# Patient Record
Sex: Female | Born: 1985 | Race: Black or African American | Hispanic: No | Marital: Single | Smoking: Former smoker
Health system: Southern US, Community
[De-identification: ages and names within clinical notes are randomized; demographics above are authoritative.]

## PROBLEM LIST (undated history)

## (undated) ENCOUNTER — Inpatient Hospital Stay (HOSPITAL_COMMUNITY): Payer: Self-pay

## (undated) DIAGNOSIS — D3162 Benign neoplasm of unspecified site of left orbit: Secondary | ICD-10-CM

## (undated) DIAGNOSIS — R030 Elevated blood-pressure reading, without diagnosis of hypertension: Secondary | ICD-10-CM

## (undated) DIAGNOSIS — I429 Cardiomyopathy, unspecified: Secondary | ICD-10-CM

## (undated) DIAGNOSIS — I10 Essential (primary) hypertension: Secondary | ICD-10-CM

## (undated) HISTORY — PX: INCISE AND DRAIN ABCESS: PRO64

## (undated) HISTORY — PX: WISDOM TOOTH EXTRACTION: SHX21

---

## 2003-12-05 ENCOUNTER — Other Ambulatory Visit: Admission: RE | Admit: 2003-12-05 | Discharge: 2003-12-05 | Payer: Self-pay | Admitting: Obstetrics and Gynecology

## 2004-05-31 ENCOUNTER — Inpatient Hospital Stay (HOSPITAL_COMMUNITY): Admission: AD | Admit: 2004-05-31 | Discharge: 2004-06-03 | Payer: Self-pay | Admitting: Obstetrics and Gynecology

## 2004-05-31 ENCOUNTER — Encounter (INDEPENDENT_AMBULATORY_CARE_PROVIDER_SITE_OTHER): Payer: Self-pay | Admitting: Specialist

## 2004-06-07 ENCOUNTER — Inpatient Hospital Stay (HOSPITAL_COMMUNITY): Admission: AD | Admit: 2004-06-07 | Discharge: 2004-06-13 | Payer: Self-pay | Admitting: Obstetrics and Gynecology

## 2004-06-12 ENCOUNTER — Encounter (INDEPENDENT_AMBULATORY_CARE_PROVIDER_SITE_OTHER): Payer: Self-pay | Admitting: Cardiology

## 2004-06-26 ENCOUNTER — Ambulatory Visit: Payer: Self-pay | Admitting: Infectious Diseases

## 2004-06-26 ENCOUNTER — Inpatient Hospital Stay (HOSPITAL_COMMUNITY): Admission: AD | Admit: 2004-06-26 | Discharge: 2004-07-01 | Payer: Self-pay | Admitting: Obstetrics and Gynecology

## 2004-06-28 ENCOUNTER — Encounter: Payer: Self-pay | Admitting: Infectious Diseases

## 2004-07-09 ENCOUNTER — Other Ambulatory Visit: Admission: RE | Admit: 2004-07-09 | Discharge: 2004-07-09 | Payer: Self-pay | Admitting: Obstetrics and Gynecology

## 2005-07-16 ENCOUNTER — Other Ambulatory Visit: Admission: RE | Admit: 2005-07-16 | Discharge: 2005-07-16 | Payer: Self-pay | Admitting: Obstetrics and Gynecology

## 2009-05-20 ENCOUNTER — Inpatient Hospital Stay (HOSPITAL_COMMUNITY): Admission: AD | Admit: 2009-05-20 | Discharge: 2009-05-21 | Payer: Self-pay | Admitting: Obstetrics & Gynecology

## 2009-07-02 ENCOUNTER — Ambulatory Visit: Payer: Self-pay | Admitting: Obstetrics and Gynecology

## 2009-07-02 ENCOUNTER — Inpatient Hospital Stay (HOSPITAL_COMMUNITY): Admission: AD | Admit: 2009-07-02 | Discharge: 2009-07-02 | Payer: Self-pay | Admitting: Obstetrics & Gynecology

## 2010-06-30 ENCOUNTER — Emergency Department (HOSPITAL_COMMUNITY): Admission: EM | Admit: 2010-06-30 | Discharge: 2010-06-30 | Payer: Self-pay | Admitting: Emergency Medicine

## 2010-12-05 LAB — URINALYSIS, ROUTINE W REFLEX MICROSCOPIC
Bilirubin Urine: NEGATIVE
Glucose, UA: NEGATIVE mg/dL
Hgb urine dipstick: NEGATIVE
Nitrite: NEGATIVE
Protein, ur: NEGATIVE mg/dL
Specific Gravity, Urine: 1.015 (ref 1.005–1.030)

## 2010-12-26 LAB — URINALYSIS, ROUTINE W REFLEX MICROSCOPIC
Ketones, ur: NEGATIVE mg/dL
Urobilinogen, UA: 0.2 mg/dL (ref 0.0–1.0)
pH: 7.5 (ref 5.0–8.0)

## 2011-02-07 NOTE — Discharge Summary (Signed)
Lydia Blair, GENRICH              ACCOUNT NO.:  [id number]   MEDICAL RECORD NO.:  [id number]          PATIENT TYPE:  INP   LOCATION:  9128                          [facility]ACILITY:  [facility]   PHYSICIAN:  Rudy Jew. Ashley Royalty, M.D.DATE OF BIRTH:  1985-09-30   DATE OF ADMISSION:  05/31/2004  DATE OF DISCHARGE:  06/03/2004                                 DISCHARGE SUMMARY   DISCHARGE DIAGNOSES:  1.  Intrauterine pregnancy at [redacted] weeks gestation, delivered.  2.  Rh negative.  3.  Group B strep carrier.  4.  Premature rupture of membranes.  5.  Nonreassuring fetal heart tracing.   OPERATION AND SPECIAL PROCEDURES:  Primary low transverse cesarean section  per Gaetano Hawthorne. Lily Peer, M.D.   CONSULTATIONS:  None.   DISCHARGE MEDICATIONS:  Percocet.   HISTORY AND PHYSICAL:  This is a 25 year old primigravida at [redacted] weeks  gestation with the aforementioned diagnoses.  The patient presented on the  day of admission complaining of feeling moist since approximately 3 a.m.  She was subsequently diagnosed as having ruptured membranes and referred to  labor and delivery for admission and delivery.  For the remainder of the  history and physical, please see chart.   HOSPITAL COURSE:  The patient was admitted to [facility] of  [place].  Admission laboratory studies were drawn.  On May 31, 2004  at 1:15 p.m., the cervix was 4 cm dilated, 90% effaced, -1 station, vertex  presentation.  Antibiotics and epidural were initiated.  On May 31, 2004 at 6:50 p.m., the patient was diagnosed as having nonreassuring fetal  heart tracing and taken to the operating room by Dr. Lily Peer. She  underwent a primary low transverse cesarean section.  The procedure yielded  a 6 pound 7 ounce female, Apgar's 8 at 1 minute, 9 at 5 minutes and sent to  the newborn nursery.  The patient's postpartum course was benign. She was  discharged on June 03, 2004 afebrile and in satisfactory condition.  WBC on admission  was 8.1, hemoglobin 13.6.  Repeat values obtained June 01, 2004 were 10.2 and 9.6 respectively.   DISPOSITION:  The patient is to return to [facility]  in 4-6 weeks for postpartum evaluation.      JAM/MEDQ  D:  08/15/2004  T:  08/16/2004  Job:  [id number]

## 2011-02-07 NOTE — Discharge Summary (Signed)
Lydia Blair, Lydia Blair              ACCOUNT NO.:  [id number]   MEDICAL RECORD NO.:  [id number]          PATIENT TYPE:  INP   LOCATION:  9304                          [facility]   PHYSICIAN:  Rudy Jew. Ashley Royalty, M.D.DATE OF BIRTH:  1986-04-30   DATE OF ADMISSION:  06/07/2004  DATE OF DISCHARGE:  06/13/2004                                 DISCHARGE SUMMARY   DIAGNOSES:  1.  Status post cesarean section.  2.  Non-compliance with OB/GYN care.  3.  Superficial wound dehiscence.  4.  Hypertension - apparently essential.   CONSULTATIONS:  [facility], Melissa L. Ladona Ridgel, M.D.   DISCHARGE MEDICATIONS:  1.  Hydrochlorothiazide 25 mg daily.  2.  Metoprolol 50 mg b.i.d.  3.  Keflex 500 mg q.i.d. times three day.  4.  K-Dur 20 mEq daily.   HISTORY AND PHYSICAL:  This is a 25 year old gravida 1, para 1, status post  cesarean section May 31, 2004.  She was delivered via C-section due to  arrest disorder of dilatation.  The procedure was uncomplicated and she was  allowed to be discharged home on or about June 03, 2004.  At the time  of the discharge, the patient was asked to take her temperature frequently  and to notify the office of any elevation of 100.4 degrees Fahrenheit or  greater.  On June 06, 2004, the patient presented to the office for an  incision check.  At that time, she denied any visual disturbances,  epigastric or right upper quadrant pain.  She also denied having a fever.  I  went on to question her whether she had taken her temperature and she said  no, I do not have a thermometer.  The incision at that time was found to  be non-indurated, non-erythematous, dry and intact.  Once again, the patient  was entreated to take her temperature often and call for any elevation as  previously mentioned.  She was also asked to call for any adverse changes  with respect to her clinical situation, including, but not limited to,  increased incisional discharge,  redness, increasing pain, etc.   The patient presented on the day of admission, complaining of leakage of  fluid from her incision.  She was once again questioned regarding any  temperature elevation and admitted that she had still not taken her  temperature and still had not obtained a thermometer.  She denied any  headaches, visual disturbances, epigastric or right upper quadrant pain.  The physical examination revealed some modest moisture to the right of  midline at the area of the patient's incision.  Careful inspection revealed  no frank dehiscence at that time.  There was no erythema or induration.  Temperature was noted to be 101.3 degrees Fahrenheit.  __________ to the  patient's low grade fever, incisional moisture and marked non-compliance  with OB/GYN care, the decision was made to admit her for further evaluation  and therapy. For the remainder of the history and physical, please see the  chart.   HOSPITAL COURSE:  The patient was admitted to [facility] of  [place].  Admission laboratory studies were drawn.  The antibiotics were  initiated.  On June 08, 2004, a very superficial wound dehiscence was  noted in the area of the right aspect of the patient's incision.  It had a  depth of only 8 to 10 mm.  Wound care was performed.  The patient's  temperature elevation was noted to be improving.  The patient was  demonstrating at this time, a modest elevation of her blood pressures.  Later on June 08, 2004, the blood pressure was noted to be  approximately 160/110.  The patient was on labetalol and the blood pressure  appeared refractory to that product. The patient denied any severe  symptoms.  As a precaution, the patient was begun on magnesium sulfate.  The  [facility] panel was benign.  Potassium was 2.8.  The differential with respect to  the hypertension included pregnancy induced versus chronic hypertension.  The patient continued to demonstrate no evidence of  any symptoms of severe  pre-eclampsia or laboratory aberrations consistent with same.  Blood  pressure remained rather refractory with averages of approximately 165  systolic and 95 to 100 diastolic.  For this reason, the [facility] hospitalist  was consulted.  Dr. Efraim Kaufmann L. Ladona Ridgel saw the patient on June 11, 2004.  Workup was initiated for hypertension.  The patient had a renal ultrasound  and a 2D echocardiogram.  As best as could be gleaned from the notes, the  over-riding clinical impression on her part was that of essential  hypertension.  She recommended some medication changes, which were  instituted and the patient's blood pressure improved.  On June 13, 2004, the patient was felt to be stable for discharge and was discharged  home in a satisfactory condition on the aforementioned medications.   __________ CLINICAL FINDINGS:  Echocardiogram was normal.  Sonogram of the  kidneys was normal.  Chest x-ray revealed no active disease.   DISPOSITION:  The patient is to return for __________ gynecology and  obstetrics in four to six weeks for post-partum evaluation.  In addition,  efforts will be undertaken to have her obtain a primary care physician for  continued care of her apparent chronic hypertension.      JAM/MEDQ  D:  08/15/2004  T:  08/16/2004  Job:  [id number]

## 2011-02-07 NOTE — Consult Note (Signed)
Lydia Blair, Lydia Blair              ACCOUNT NO.:  [id number]   MEDICAL RECORD NO.:  [id number]          PATIENT TYPE:  INP   LOCATION:  9304                          [facility]   PHYSICIAN:  Melissa L. Ladona Ridgel, MD  DATE OF BIRTH:  03-08-1986   DATE OF CONSULTATION:  06/11/2004  DATE OF DISCHARGE:  06/13/2004                                   CONSULTATION   REQUESTING PHYSICIAN:  Dr. Sylvester Harder.   REASON FOR CONSULTATION:  Uncontrolled hypertension in a postpartum patient.   HISTORY OF PRESENT ILLNESS:  The patient is a 25 year old African-American  female status post C-section delivery of her first child.  The patient does  not have a primary care physician and we have been asked to assist with her  current hypertensive issues.  The patient underwent C-section secondary to  arrested dilatation approximately 2 weeks ago.  There is no report of  predelivery blood pressure issues.  The patient returned to her post  delivery follow-up appointment and initially complained of some discharge  from the wound and she was requested to follow her temperatures, and return  for another visit.  The patient returned for a visit not having followed up  on her temperatures and further complained of wound problems.  The wound was  found to be erythematous with some discharge and she was febrile.  She  therefore was admitted to the hospital.  During the admission, the patient  was found to have both elevated systolic and diastolic blood pressures with  low potassium.  The cause for this was unclear.  Pregnancy-induced  hypertension is a possibility; however, at this time the source for her  blood pressure elevations is unclear.   PAST MEDICAL HISTORY:  She has none.  She did have group B strep infection  noted during her prepregnancy exam.  Her blood pressures during her prenatal  period were 110/60.   PAST SURGICAL HISTORY:  She did have a C-section secondary to failure to  dilate.   ALLERGIES:  No known drug allergies.   MEDICATIONS:  She was taking oxycodone at home for pain.   SOCIAL HISTORY:  She quit smoking about a year ago.  She denies ethanol.  She denies illicit drug use.   FAMILY HISTORY:  Mom and dad are deceased with no documented medical  history.  She states that she is not aware of anybody in the family having  renal disease, being on dialysis, or having hypertension.   HOSPITAL MEDICATIONS:  1.  Potassium 20 mEq b.i.d.  2.  Labetalol 200 mg p.o. t.i.d.  3.  Codeine p.r.n.  4.  Phenergan p.r.n.  5.  Ambien 10 mg p.r.n.  6.  Hydralazine 2-5 mg p.r.n.   She is also on lactated Ringers at 75 mL/hour.   REVIEW OF SYSTEMS:  No nausea, vomiting, diarrhea, blurred vision, or  headache.  She denies palpitations, dysuria.  All other review of systems  are negative.   PHYSICAL EXAMINATION:  VITAL SIGNS:  Temperature is 97.6, blood pressure  155/89 - this was status post 10 mg of hydralazine.  Her  previous blood  pressure was 180/105 to 110.  Respirations are 20, saturations are 98%.  GENERAL:  This is a mildly obese African-American female in no acute  distress with a withdrawn affect.  HEENT:  Her pupils are equal, round, reactive to light.  Extraocular muscles  are intact.  Mucous membranes are moist.  NECK:  Supple.  There is no JVD, no lymph nodes.  CHEST:  Clear to auscultation.  There are no rhonchi, rales, or wheezes.  CARDIOVASCULAR:  Regular rate and rhythm, positive S1, S2.  No S3, S4.  No  murmurs, rubs, or gallops.  ABDOMEN:  Soft, obese, nontender, nondistended, with positive bowel sounds.  The incision is clean, dry, and intact.  The left side of the incision  appears to be healing well.  EXTREMITIES:  No clubbing, cyanosis, or edema.  NEUROLOGIC:  Cranial nerves II-XII are intact.  Power is 5/5.  DTRs are 2+.   Her last laboratories reveal a uric acid of 4.4, magnesium of 4.6, an LDH of  385, white cells of 7.5, hemoglobin of 9,  hematocrit of 26.9, platelets of  404.  Her sodium is 142, potassium 2.7, BUN is 1, creatinine is 0.7.   ASSESSMENT AND PLAN:  This is a 25 year old African-American female admitted  for postpartum fever workup, was found to be hypertension, with decreased  potassium.   1.  Cardiovascular.  The patient would benefit from a 2-D echocardiogram to      assess end-organ damage with regard to longstanding blood pressure      issues.  This would also elicit any cardiac causes for her hypertension.      At this time I would change her diet to a no added salt and limit her      salt intake.  Because of the increased delivery of sodium with the      lactated Ringers, I would discontinue her lactated Ringers and begin      half normal saline with 20 of potassium.  2.  Genitourinary.  I would proceed with a minimal renal workup for possible      causes for blood pressure; in particular, I would check an ultrasound on      the bilateral kidneys to assess size and echogenicity.  If the kidneys      are slightly decreased in size, I would consider MRA to rule out renal      artery stenosis and I would also check an aldosterone level.  3.  Need to rule out occult causes for hypertension which in this case would      mean checking a urine drug screen.  4.  Hypertension management.  At this time I would start lisinopril 10 mg      p.o. daily.  Continue the labetalol at this time.  However, I would make      this 200 mg p.o. b.i.d. with hold parameters for heart rates less than      60 or systolic blood pressures less than 110.  I would start her on      hydralazine 10 mg p.o. q.i.d. with hold parameters for systolic less      than 110 and utilize the hydralazine p.r.n. for blood pressures greater      than 190 systolically, or diastolic blood pressures greater than 110.  5.  I would replete her potassium orally and IV to between 3.5 and 4.  We will continue to follow with you as we care for this  patient's blood  pressure.  Thank you for allowing [place] to see her.     Meli   MLT/MEDQ  D:  06/14/2004  T:  06/14/2004  Job:  [id number]

## 2011-02-07 NOTE — H&P (Signed)
NAMESTEPAHNIE, Lydia Blair                        ACCOUNT NO.:  [id number]   MEDICAL RECORD NO.:  [id number]                   PATIENT TYPE:  INP   LOCATION:  9317                                 [facility]   PHYSICIAN:  James A. Ashley Royalty, M.D.             DATE OF BIRTH:  July 31, 1986   DATE OF ADMISSION:  06/07/2004  DATE OF DISCHARGE:                                HISTORY & PHYSICAL   HISTORY OF PRESENT ILLNESS:  This is a 25 year old gravida 1 para 1 status  post C-section May 31, 2004 who presented to the office complaining of  incisional discharge.  Her prenatal care was complicated by GBS carrier  status, B negative blood type only.  She delivered via cesarean section  May 31, 2004 secondary to arrest disorder of dilatation.  The procedure  was uncomplicated and she was allowed to be discharged home on or about  June 03, 2004.  At the time of the discharge the patient was asked to  take her temperature frequently and notify the office of any elevation of  100.4 degrees Fahrenheit or greater.   On June 06, 2004 the patient presented to the office for incision  check.  At that time she denied any visual disturbances, epigastric, or  right upper quadrant pain or fever.  I did question her as to whether she  had taken her temperature and she said, no, I don't have a thermometer.  Incision was found to be nonerythematous, nonindurated.  She was asked to  take her temperature once again and call for any elevation as mentioned  above.  She was also asked to call for any adverse changes with respect to  her clinical situation including but not limited to increased incisional  discharge, redness, increasing pain, etc.   The patient presented today complaining of continued leakage of fluid from  her incision.  She was once again questioned regarding any temperature  elevation and admitted she still had not taken her temperature and still had  not obtained a  thermometer.  She denies any headaches, visual disturbances,  epigastric, or right upper quadrant pain.  She has been taking approximately  two oxycodone tablets per 24 hours for incisional discomfort.   MEDICATIONS:  As above.   PAST MEDICAL HISTORY:  1.  Medical:  Negative.  2.  Surgical:  Negative.  3.  Allergies:  Negative.   FAMILY HISTORY:  Noncontributory.   SOCIAL HISTORY:  The patient denies use of tobacco or significant alcohol.   REVIEW OF SYSTEMS:  Noncontributory.   PHYSICAL EXAMINATION:  GENERAL:  Well-developed, well-nourished, black  female in no acute distress.  VITAL SIGNS:  Temperature 101.3 degrees Fahrenheit, blood pressure 140/100,  vital signs stable.  SKIN:  Warm and dry without lesions.  LYMPH:  There is no supraclavicular, cervical, or inguinal adenopathy.  HEENT:  Normocephalic.  NECK:  Supple without thyromegaly.  CHEST:  Lungs are clear.  CARDIAC:  Regular rate and rhythm without murmurs, gallops, or rubs.  BREASTS:  Bilaterally without dominant mass, retraction, adenopathy,  erythema, or induration.  ABDOMEN:  Soft and nontender.  Bowel sounds are active.  Inspection of the  incision reveals moisture present (modest) just to the right of the midline.  Careful inspection reveals no dehiscence.  There is no erythema or  induration whatsoever.  The Steri-Strips removed and the incision probed.  Again, no area of dehiscence was noted.  The incision was cleansed with  peroxide and Steri-Strips reapplied with Benzoin.  MUSCULOSKELETAL:  No CVA tenderness.  DTRs are 2+ and equal.  There is trace  edema.  PELVIC:  External genitalia within normal limits.  Vagina and cervix were  without gross lesions.  Bimanual examination reveals the uterus to be  approximately 14-week size.  There is modest tenderness over the fundus with  palpation.   IMPRESSION:  1.  Status post cesarean section 1 week ago.  2.  Fever - differential includes endometritis, possible  incisional      infection (doubt), etc.  3.  Noncompliance with obstetrical and gynecological care and      recommendations.   PLAN:  Admit to [facility].  Will perform fever workup including PIH  panel.  Will initiate IV antibiotics.  Will watch incision carefully.                                               James A. Ashley Royalty, M.D.    JAM/MEDQ  D:  06/07/2004  T:  06/07/2004  Job:  [id number]

## 2011-02-07 NOTE — H&P (Signed)
Lydia Blair, Lydia Blair                        ACCOUNT NO.:  [id number]   MEDICAL RECORD NO.:  [id number]                   PATIENT TYPE:  INP   LOCATION:  9174                                 [facility]   PHYSICIAN:  Rudy Jew. Ashley Royalty, M.D.             DATE OF BIRTH:  [birth year]   DATE OF ADMISSION:  05/31/2004  DATE OF DISCHARGE:                                HISTORY & PHYSICAL   HISTORY OF PRESENT ILLNESS:  This is a 25 year old primigravida, [facility]  June 21, 2004, at 37 weeks.  Prenatal care has been complicated by GBS  carrier, B-negative blood type for which she received RhoGAM at 28 weeks.   The patient presented today for routine office visit.  She stated she had  been feeling moist since approximately 3 a.m.  Sterile speculum  examination was performed which revealed positive pooling, positive ferning  and positive Nitrazine.  The patient is hence referred to labor and delivery  for admission, and delivery.   MEDICATION:  Vitamins.   PAST MEDICAL HISTORY:  Please see [place] forms.   PHYSICAL EXAMINATION:  GENERAL:  Well-developed, well-nourished, pleasant  black female in no acute distress, afebrile.  VITAL SIGNS:  Vital signs stable.  SKIN:  Skin warm and dry without lesions.  LYMPH:  There is no supraclavicular, cervical or inguinal adenopathy.  HEENT:  Normocephalic.  NECK:  Neck supple without thyromegaly.  CHEST:  Lungs are clear.  CARDIAC:  Regular rate and rhythm.  BREAST EXAM:  Deferred.  ABDOMEN:  Abdomen soft and nontender.  It is gravid with a fundal height of  approximately 36 cm.  Fetal heart tones are auscultated with a Doppler.  PELVIC:  Please see sterile speculum examination above.  Digital examination  deferred until patient is evaluated at [facility].   IMPRESSION:  1.  Intrauterine pregnancy at 37 weeks' gestation.  2.  B-negative blood type.  3.  Group B streptococcus carrier.  4.  Premature rupture of membranes.   PLAN:   Admit to [facility].  Will confirm presentation and assess the  need for Pitocin augmentation.  Will give antibiotic prophylaxis.                                              James A. Ashley Royalty, M.D.   JAM/MEDQ  D:  05/31/2004  T:  05/31/2004  Job:  [id number]

## 2011-02-07 NOTE — Discharge Summary (Signed)
Lydia Blair, Lydia Blair              ACCOUNT NO.:  [id number]   MEDICAL RECORD NO.:  [id number]          PATIENT TYPE:  INP   LOCATION:  9320                          FACILITY:  [facility]   PHYSICIAN:  James A. Ashley Royalty, M.D.DATE OF BIRTH:  July 05, 1986   DATE OF ADMISSION:  06/26/2004  DATE OF DISCHARGE:  07/01/2004                                 DISCHARGE SUMMARY   DISCHARGE DIAGNOSES:  1.  Status post cesarean section.  2.  Rectus hematoma/abscess post cesarean section.  3.  Hypertension - probably essential - stable.   OPERATIONS AND SPECIAL PROCEDURES:  Hematoma/abscess incision with placement  of drain per interventional radiology - June 28, 2004.   CONSULTATIONS:  1.  Interventional radiology.  2.  Infectious disease (Dr. Ninetta Lights).   MEDICATIONS:  1.  Augmentin 875 mg b.i.d.  2.  Metoprolol 50 mg b.i.d.   HISTORY AND PHYSICAL:  This is a 25 year old gravida 1 para 1 status post  cesarean section May 31, 2004.  The patient was rehospitalized  June 07, 2004 complaining of incisional discharge.  She had been  noncompliant at home with respect to taking her temperature and it was felt  in her best interest to be admitted for further evaluation and therapy.  In  the course of the hospitalization, a superficial wound dehiscence was noted  which appeared to essentially completely low.  The low-grade fever was  worked up and felt to be secondary to endometritis.  The patient defervesced  nicely.  During the course of the hospitalization, the blood pressure was  noted to be frequently elevated.  [facility] were consulted.  The  workup revealed essential hypertension.  The patient was discharged home on  antihypertensive medications including metoprolol 50 mg b.i.d.,  hydrochlorothiazide 25 mg b.i.d., Keflex, and supplemental potassium.  The  patient was instructed to call the number provided by the hospitalists in  order to obtain a primary care physician but made no  such call.  She  presented to the office earlier in the week for a recheck.  She stated the  incision was slightly tender on the right side.  However, she denied any  discoloration, discharge, or fever.  She was given a prescription for  Augmentin 875 mg b.i.d. and asked to call for any adverse changes, and also  to call on June 26, 2004 for an update.  She called on the morning of  admission stating her temperature was 100.3 degrees Fahrenheit.  She was  asked to come to maternity admissions immediately for further evaluation.  At the time of the admission, the patient was noted to be afebrile.  There  was perhaps a 2 x 2 mm dehiscence of the right aspect of the uterine  incision, but no other abnormalities were appreciated.  Due to the history  of the fever and noncompliance with her GYN care, the decision was made to  admit the patient for further observation and therapy.  For the remainder of  the history and physical please see chart.   HOSPITAL COURSE:  The patient was admitted to [facility].  Admission laboratory studies were drawn.  The following  morning, the patient was noted to have had a temperature as high as 103  degrees Fahrenheit.  Workup was continued including a CT scan which revealed  a 7 x 5 x 4 cm hematoma versus abscess in the rectus area on the right  aspect of the patient's abdomen.  Infectious disease was consulted.  Dr.  Ninetta Lights saw the patient and felt the most likely diagnosis was a  postoperative abscess in the rectus area.  Antibiotics were changed from  Mefoxin to Zosyn.  A protuberance was noted in the abdomen in the area of  the right rectus muscle.  The decision was made to consult interventional  radiology for possible drainage.  On June 28, 2004 interventional  radiology succeeded in draining the fluid collection and placing a Al Pimple drain into the bed.  Cultures were obtained.  Ultimate culture  revealed micrococcus.   There was no elaboration or sensitivities as of the  time of this dictation.  The patient defervesced.  Repeat CT scan was  obtained July 01, 2004 and revealed resolution of the previously-noted  mass.  On July 01, 2004 the patient was noted to be afebrile for greater  than 24 hours.  She stated a desire to be discharged home, and was  discharged home afebrile and in satisfactory condition.   ACCESSORY CLINICAL FINDINGS:  White blood count on October 5 revealed a  value of 13,300.  Repeat value obtained June 28, 2004 was 10.8.  Blood  cultures were initially without any growth.  C. difficile toxin was  negative.  Catheterized urine culture was negative.  Partial thromboplastin  time was 43.  CBC on October 7 prior to discharge revealed a white blood  count of 10,000; hemoglobin 12.4; and hematocrit 36.1.   PA and lateral chest was negative.   DISPOSITION:  The patient is to return to [facility] and Obstetrics  in approximately 1 week.  She was specifically instructed to call for any  temperature elevation greater than 100.4 degrees Fahrenheit.  She is to  continue her metoprolol 50 mg b.i.d. and Augmentin 875 mg b.i.d.  We will  allow her to come off her hydrochlorothiazide for the time being and assess  the blood pressure on an outpatient basis.   The patient is again entreated to call the number provided to her by the  [facility] in order to obtain a primary care physician.  She states  she understands and accepts.  Questions invited and answered.      JAM/MEDQ  D:  07/01/2004  T:  07/02/2004  Job:  [id number]

## 2011-02-07 NOTE — Op Note (Signed)
Lydia Blair, Lydia Blair                        ACCOUNT NO.:  [id number]   MEDICAL RECORD NO.:  [id number]                   PATIENT TYPE:  INP   LOCATION:  9174                                 [facility]ACILITY:  [facility]   PHYSICIAN:  Juan H. Lily Peer, M.D.             DATE OF BIRTH:  1985-12-10   DATE OF PROCEDURE:  05/31/2004  DATE OF DISCHARGE:                                 OPERATIVE REPORT   INDICATIONS FOR PROCEDURE:  A 25 year old, primigravida, [redacted] weeks gestation  admitted earlier today with spontaneous rupture of membranes, GBS carrier  was started on Pen-G for prophylaxis and the patient progressed in labor to  complete dilatation but prior that she had been started on Pitocin  augmentation due to slight protracted laboring and began experiencing fetal  bradycardia to the 50-70 beat per minute range and despite discontinuation  of the Pitocin, lateral positioning and oxygen administration, fetal  bradycardia persisted. She was taken for emergency primary cesarean section.   PREOPERATIVE DIAGNOSES:  1.  Term intrauterine pregnancy at 39 weeks estimated gestational age.  2.  Spontaneous rupture of membranes.  3.  Fetal distress/fetal bradycardia.   POSTOPERATIVE DIAGNOSES:  1.  Term intrauterine pregnancy at 73 weeks estimated gestational age.  2.  Spontaneous rupture of membranes.  3.  Fetal distress/fetal bradycardia.   ANESTHESIA:  Epidural.   SURGEON:  Juan H. Lily Peer, M.D.   PROCEDURE:  Emergency primary lower uterine segment transverse cesarean  section.   FINDINGS:  Nuchal cord x1, viable female infant, Apgar's of 8 & 9, clear  amniotic fluid, normal maternal pelvic anatomy, arterial cord pH of 7.28,  weight of 6 pounds 7 ounces.   DESCRIPTION OF PROCEDURE:  The patient was taken emergently to the operating  room due to the persistent fetal bradycardia. As she was being prepped, the  last fetal heart rate was recorded at 103 beats per minute and pelvic  examination the  vertex was in the +1 station.  Epidural was redosed, the  abdomen was prepped emergently, Foley catheter was already in place. A  Pfannenstiel skin incision was made 2 cm above the symphysis pubis, the  incision was carried down from the skin and subcutaneous tissue and down to  the rectus fascia where a midline nick was made. The fascia was incised a  transverse fashion, the peritoneal cavity was entered cautiously, the  bladder flap was established, the lower uterine segment was incised in a  transverse fashion. Clear amniotic fluid was present, the newborn's head was  delivered, the nuchal cord was reduced. The nasopharyngeal area was bulb  suctioned and the remainder of the newborn was delivered.  The newborn gave  an immediate cry after the cord was clamped and cut, shown to the parents  and passed off to the pediatrician who gave the above mentioned parameters.  After cord blood was obtained, the placenta was delivered from the  intrauterine cavity and was  submitted for pathological evaluation.  Cord  blood had previously been obtained.  The uterus was then exteriorized, the  intrauterine cavity was swept clear of the remaining products of conception.  The uterus was closed in a single layer fashion with #0 Vicryl suture in a  locking stitch manner.  Both tubes and ovaries were inspected and appear  normal in appearance. The uterus was placed back in the pelvic cavity, the  pelvic cavity was copiously irrigated with normal saline solution. After  ascertaining adequate hemostasis was eminent, closure was started. The  visceroperitoneum was not reapproximated but the rectus fascia was closed  with a running stitch of #0 Vicryl suture.  The subcutaneous bleeders were  Bovie cauterized, the skin was reapproximated with skin clips followed by  placement of Xeroform gauze and 4 x 8 dressing. The patient was transferred  to the recovery room with stable vital signs. Blood loss for the  procedure  was 1200 mL.  IV fluid 1200 mL of lactated Ringer's, urine output 400 mL.                                               Juan H. Lily Peer, M.D.    JHF/MEDQ  D:  05/31/2004  T:  06/01/2004  Job:  [id number]   cc:   Fayrene Fearing A. Ashley Royalty, M.D.  [address]  [place], [place] [address]  Fax: [phone]

## 2011-02-07 NOTE — H&P (Signed)
Lydia Blair, Lydia Blair              ACCOUNT NO.:  [id number]   MEDICAL RECORD NO.:  [id number]          PATIENT TYPE:  MAT   LOCATION:  MATC                          [facility]   PHYSICIAN:  Rudy Jew. Ashley Royalty, M.D.DATE OF BIRTH:  06-04-1986   DATE OF ADMISSION:  06/26/2004  DATE OF DISCHARGE:                                HISTORY & PHYSICAL   HISTORY OF PRESENT ILLNESS:  This is a 25 year old, gravida 1, para 1 status  post cesarean section May 31, 2004. The patient was rehospitalized  June 07, 2004 complaining of incisional discharge. She had been  noncompliant at home with respect to taking her temperature and it was felt  in her best interest to be admitted for further evaluation and therapy. In  the course of the hospitalization, the small superficial wound dehiscence  appeared to completely close. A low grade fever was worked up and felt to be  secondary to endometritis. The patient defervesced  nicely. During the  course of the hospitalization, the blood pressure was noted to be frequently  elevated. The patient was placed on labetalol and later hydralazine. She was  also noted to be hypokalemic. [facility] hospitalist was consulted. The result  included a change of medications including metoprolol 50 mg b.i.d.,  hydrochlorothiazide 25 mg b.i.d., Keflex 500 mg q.i.d. and K-Dur 20 mEq q.d.  The patient was instructed to call the number provided by the hospitalist in  order to obtain a primary care physician but to date has made no such call.  The patient presented to the office earlier in the week for a brief check.  He stated the incision was slightly tender empirically on the right side.  However, she denied any discoloration, discharge, or current fever. Exam was  benign.  The patient was given a prescription for Augmentin 875 mg b.i.d.  and asked to call for any adverse change and also to call today to give [place]  an update. She called this morning stating that she had a  temperature of  101.3 and was asked immediately to come to Maternity Admissions as the  office would not be seeing patients this afternoon.   After arrival at Maternity Admissions Unit at [facility], the patient  was questioned. She denied sputum production, calf tenderness, dysuria,  incisional drainage. Her only complaint was a very modest discomfort in the  right lower quadrant which she rated only 3-4 on a scale of 0-10.   MEDICATIONS:  As above.   PAST MEDICAL HISTORY:  Medication negative. Surgical, cesarean section.  Allergies negative, history noncontributory.   SOCIAL HISTORY:  The patient denies use of tobacco or alcohol.   REVIEW OF SYMPTOMS:  Noncontributory.   PHYSICAL EXAMINATION:  GENERAL:  Well-developed, well-nourished, pleasant,  black female in no acute distress.  VITAL SIGNS:  Temperature in Maternity Admissions Unit 98.7, pulse 88,  respirations 20, blood pressure 101/61.  SKIN:  Warm and dry without lesions.  LYMPH:  There is no supraclavicular, cervical or inguinal adenopathy.  HEENT:  Normocephalic.  NECK:  Supple without thyromegaly.  CHEST:  Lungs are clear.  CARDIAC:  Regular rate and rhythm without murmurs, rubs or gallops.  BREASTS:  There is no dominant mass, traction or adenopathy bilaterally.  ABDOMEN:  Soft, nontender without masses or organomegaly. Bowel sounds are  active. The incision appears to be essentially healed. There is perhaps a 2  x 2 mm dehiscence on the right side which is no more than 2 mm deep.  There  is no erythema, induration, discharge or discoloration.  PELVIC:  External genitalia within normal limits. Vagina and cervix without  gross lesions. Bimanual examination reveals the uterus to be approximately  16 week size and no adnexal masses are palpable.   IMPRESSION:  1.  Status post cesarean section May 31, 2004.  2.  Hypertension--expressed postoperatively only. Probable essential      hypertension.  3.  Alleged  low grade fever as an outpatient, non substantiated after      arrival at [facility] hospital.   PLAN:  Admit for observation and appropriate workup. The patient states she  understands and accepts and is comfortable with this plan.      JAM/MEDQ  D:  06/26/2004  T:  06/26/2004  Job:  [id number]

## 2011-05-02 ENCOUNTER — Emergency Department (HOSPITAL_COMMUNITY)
Admission: EM | Admit: 2011-05-02 | Discharge: 2011-05-02 | Disposition: A | Discharge status: Home / Self Care | Payer: Medicaid Other | Attending: Emergency Medicine | Admitting: Emergency Medicine

## 2011-05-02 ENCOUNTER — Emergency Department (HOSPITAL_COMMUNITY): Payer: Medicaid Other

## 2011-05-02 DIAGNOSIS — S93409A Sprain of unspecified ligament of unspecified ankle, initial encounter: Secondary | ICD-10-CM | POA: Insufficient documentation

## 2011-05-02 DIAGNOSIS — R05 Cough: Secondary | ICD-10-CM | POA: Insufficient documentation

## 2011-05-02 DIAGNOSIS — W108XXA Fall (on) (from) other stairs and steps, initial encounter: Secondary | ICD-10-CM | POA: Insufficient documentation

## 2011-05-02 DIAGNOSIS — R0789 Other chest pain: Secondary | ICD-10-CM | POA: Insufficient documentation

## 2011-05-02 DIAGNOSIS — R059 Cough, unspecified: Secondary | ICD-10-CM | POA: Insufficient documentation

## 2011-05-02 DIAGNOSIS — J029 Acute pharyngitis, unspecified: Secondary | ICD-10-CM | POA: Insufficient documentation

## 2011-05-02 DIAGNOSIS — J40 Bronchitis, not specified as acute or chronic: Secondary | ICD-10-CM | POA: Insufficient documentation

## 2011-05-02 DIAGNOSIS — R0602 Shortness of breath: Secondary | ICD-10-CM | POA: Insufficient documentation

## 2011-05-02 DIAGNOSIS — J069 Acute upper respiratory infection, unspecified: Secondary | ICD-10-CM | POA: Insufficient documentation

## 2012-04-06 ENCOUNTER — Emergency Department (HOSPITAL_COMMUNITY)
Admission: EM | Admit: 2012-04-06 | Discharge: 2012-04-06 | Disposition: A | Discharge status: Home / Self Care | Payer: Medicaid Other | Attending: Emergency Medicine | Admitting: Emergency Medicine

## 2012-04-06 ENCOUNTER — Encounter (HOSPITAL_COMMUNITY): Payer: Self-pay | Admitting: *Deleted

## 2012-04-06 DIAGNOSIS — H109 Unspecified conjunctivitis: Secondary | ICD-10-CM | POA: Insufficient documentation

## 2012-04-06 DIAGNOSIS — F17200 Nicotine dependence, unspecified, uncomplicated: Secondary | ICD-10-CM | POA: Insufficient documentation

## 2012-04-06 MED ORDER — NEOMYCIN-POLYMYXIN-HC 3.5-10000-1 OP SUSP: 2.0000 [drp] | Freq: Four times a day (QID) | OPHTHALMIC | Status: AC

## 2012-04-06 NOTE — ED Provider Notes (Signed)
History     CSN: [id number]  Arrival date & time 04/06/12  1438   First MD Initiated Contact with Patient 04/06/12 1537      Chief Complaint  Patient presents with  . Allergic Reaction    (Consider location/radiation/quality/duration/timing/severity/associated sxs/prior treatment) HPI  Patient presents to the emergency department with complaints of left eye being swollen, red and itchy. 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 She says that she takes allergy medication on a daily basis and that it has not been helping her eye. She denies sneezing, coughing, and both eyes itching, runny nose, hives. She denies her left eye being painful, or trauma to the eye. She denies change of vision. She admits to discharge in the eye in the morning when she wakes up. She has been using warm moist compresses but admits that it is not making the redness and tiching resolve.  History reviewed. No pertinent past medical history.  Past Surgical History  Procedure Date  . Cesarean section     No family history on file.  History  Substance Use Topics  . Smoking status: Current Some Day Smoker  . Smokeless tobacco: Not on file  . Alcohol Use: Yes    OB History    Grav Para Term Preterm Abortions TAB SAB Ect Mult Living                  Review of Systems   HEENT: denies blurry vision or change in hearing PULMONARY: Denies difficulty breathing and SOB CARDIAC: denies chest pain or heart palpitations MUSCULOSKELETAL:  denies being unable to ambulate ABDOMEN AL: denies abdominal pain GU: denies loss of bowel or urinary control NEURO: denies numbness and tingling in extremities SKIN: no new rashes PSYCH: patient denies anxiety or depression. NECK: Pt denies having neck pain     Allergies  Review of patient's allergies indicates no known allergies.  Home Medications   Current Outpatient Rx  Name Route Sig Dispense  Refill  . NORETHIN-ETH ESTRAD-FE BIPHAS 1 MG-10 MCG / 10 MCG PO TABS Oral Take 1 tablet by mouth daily.    . NEOMYCIN-POLYMYXIN-HC 3.5-10000-1 OP SUSP Ophthalmic Apply 2 drops to eye every 6 (six) hours. 7.5 mL 0    BP 116/82  Pulse 72  Temp 98 F (36.7 C) (Oral)  Resp 16  SpO2 98%  LMP 01/22/2012  Physical Exam  Nursing note and vitals reviewed. Constitutional: She appears well-developed and well-nourished. No distress.  HENT:  Head: Normocephalic and atraumatic.  Eyes: EOM are normal. Pupils are equal, round, and reactive to light. Right eye exhibits no discharge and no exudate. Left eye exhibits discharge. Left eye exhibits no exudate. Right conjunctiva is not injected. Right conjunctiva has no hemorrhage. Left conjunctiva is injected. Left conjunctiva has no hemorrhage.  Neck: Normal range of motion. Neck supple.  Cardiovascular: Normal rate and regular rhythm.   Pulmonary/Chest: Effort normal.  Abdominal: Soft.  Neurological: She is alert.  Skin: Skin is warm and dry.    ED Course  Procedures (including critical care time)  Labs Reviewed - No data to display No results found.   1. Conjunctivitis of left eye       MDM  pts symptoms not consistent with allergies as it is unilateral with no other allergy symptoms. Will treat with abx and give referral to Optho.   Education given on proper eye care and how not to  spread infection to other eye or other people.  Pt has been advised of the symptoms that warrant their return to the ED. Patient has voiced understanding and has agreed to follow-up with the PCP or specialist.          Dorthula Matas, PA 04/06/12 1610

## 2012-04-06 NOTE — ED Notes (Signed)
Pt states pain to left eye, swollen, pain to face. States "I think I'm allergic to cats". States daughter came home with cat hair on her. No diff breathing.

## 2012-04-07 NOTE — ED Provider Notes (Signed)
Medical screening examination/treatment/procedure(s) were performed by non-physician practitioner and as supervising physician I was immediately available for consultation/collaboration.   Nataliah Hatlestad, MD 04/07/12 2223 

## 2012-09-22 DIAGNOSIS — I429 Cardiomyopathy, unspecified: Secondary | ICD-10-CM

## 2012-09-22 HISTORY — DX: Cardiomyopathy, unspecified: I42.9

## 2013-02-11 ENCOUNTER — Ambulatory Visit: Payer: Medicaid Other | Admitting: Obstetrics

## 2013-02-28 ENCOUNTER — Ambulatory Visit: Payer: Medicaid Other | Admitting: Obstetrics

## 2013-03-28 ENCOUNTER — Ambulatory Visit: Payer: Medicaid Other | Admitting: Obstetrics

## 2013-06-02 ENCOUNTER — Ambulatory Visit (INDEPENDENT_AMBULATORY_CARE_PROVIDER_SITE_OTHER): Payer: Medicaid Other | Admitting: Obstetrics

## 2013-06-02 ENCOUNTER — Encounter: Payer: Self-pay | Admitting: Obstetrics

## 2013-06-02 VITALS — BP 114/73 | HR 69 | Temp 97.7°F | Ht 62.0 in | Wt 163.0 lb

## 2013-06-02 DIAGNOSIS — N760 Acute vaginitis: Secondary | ICD-10-CM

## 2013-06-02 DIAGNOSIS — Z113 Encounter for screening for infections with a predominantly sexual mode of transmission: Secondary | ICD-10-CM

## 2013-06-02 NOTE — Progress Notes (Signed)
Subjective:     Lydia Blair is a 27 y.o. female here for a routine exam.  Current complaints: STD check. Pt states she was recently diagnosed with a bacterial infection on 05-31-13. Pt states she has the treatment but has not started it yet.   Personal health questionnaire reviewed: yes.   Gynecologic History Patient's last menstrual period was 05/15/2013. for STD  Contraception: OCP (estrogen/progesterone) Last Pap: 09/2012 Results were: normal   Obstetric History OB History  No data available     The following portions of the patient's history were reviewed and updated as appropriate: allergies, current medications, past family history, past medical history, past social history, past surgical history and problem list.  Review of Systems Pertinent items are noted in HPI.    Objective:    General appearance: alert and no distress Abdomen: normal findings: soft, non-tender Pelvic: Vagina with gray, thin discharge.  Assessment:    BV   Plan:    Education reviewed: safe sex/STD prevention and management of BV. Contraception: OCP (estrogen/progesterone). Metronidazole Rx elsewhere.

## 2013-06-03 ENCOUNTER — Other Ambulatory Visit: Payer: Self-pay | Admitting: *Deleted

## 2013-06-03 DIAGNOSIS — B9689 Other specified bacterial agents as the cause of diseases classified elsewhere: Secondary | ICD-10-CM

## 2013-06-03 LAB — WET PREP BY MOLECULAR PROBE
Gardnerella vaginalis: POSITIVE — AB
Trichomonas vaginosis: NEGATIVE

## 2013-06-03 LAB — GC/CHLAMYDIA PROBE AMP
CT Probe RNA: NEGATIVE
GC Probe RNA: NEGATIVE

## 2013-06-03 LAB — HEPATITIS C ANTIBODY: HCV Ab: NEGATIVE

## 2013-06-03 LAB — HIV ANTIBODY (ROUTINE TESTING W REFLEX): HIV: NONREACTIVE

## 2013-06-03 MED ORDER — METRONIDAZOLE 500 MG PO TABS: 500.0000 mg | ORAL_TABLET | Freq: Two times a day (BID) | ORAL | Status: DC

## 2013-09-08 ENCOUNTER — Other Ambulatory Visit: Payer: Self-pay | Admitting: *Deleted

## 2013-09-08 ENCOUNTER — Telehealth: Payer: Self-pay | Admitting: *Deleted

## 2013-09-08 DIAGNOSIS — IMO0001 Reserved for inherently not codable concepts without codable children: Secondary | ICD-10-CM

## 2013-09-08 MED ORDER — NORGESTIM-ETH ESTRAD TRIPHASIC 0.18/0.215/0.25 MG-35 MCG PO TABS: 1.0000 | ORAL_TABLET | Freq: Every day | ORAL | Status: DC

## 2013-09-08 NOTE — Telephone Encounter (Signed)
Error

## 2013-09-24 ENCOUNTER — Emergency Department (HOSPITAL_COMMUNITY): Payer: Medicaid Other

## 2013-09-24 ENCOUNTER — Encounter (HOSPITAL_COMMUNITY): Payer: Self-pay | Admitting: Emergency Medicine

## 2013-09-24 ENCOUNTER — Emergency Department (HOSPITAL_COMMUNITY)
Admission: EM | Admit: 2013-09-24 | Discharge: 2013-09-24 | Disposition: A | Discharge status: Home / Self Care | Payer: Medicaid Other | Attending: Emergency Medicine | Admitting: Emergency Medicine

## 2013-09-24 DIAGNOSIS — R42 Dizziness and giddiness: Secondary | ICD-10-CM | POA: Insufficient documentation

## 2013-09-24 DIAGNOSIS — R079 Chest pain, unspecified: Secondary | ICD-10-CM | POA: Insufficient documentation

## 2013-09-24 DIAGNOSIS — R110 Nausea: Secondary | ICD-10-CM | POA: Insufficient documentation

## 2013-09-24 DIAGNOSIS — F17200 Nicotine dependence, unspecified, uncomplicated: Secondary | ICD-10-CM | POA: Insufficient documentation

## 2013-09-24 DIAGNOSIS — J111 Influenza due to unidentified influenza virus with other respiratory manifestations: Secondary | ICD-10-CM | POA: Insufficient documentation

## 2013-09-24 DIAGNOSIS — R6889 Other general symptoms and signs: Secondary | ICD-10-CM

## 2013-09-24 DIAGNOSIS — Z79899 Other long term (current) drug therapy: Secondary | ICD-10-CM | POA: Insufficient documentation

## 2013-09-24 DIAGNOSIS — R630 Anorexia: Secondary | ICD-10-CM | POA: Insufficient documentation

## 2013-09-24 MED ORDER — IBUPROFEN 400 MG PO TABS
400.0000 mg | ORAL_TABLET | Freq: Once | ORAL | Status: AC
  Administered 2013-09-24: 400 mg via ORAL
  Filled 2013-09-24: qty 1

## 2013-09-24 MED ORDER — ACETAMINOPHEN 500 MG PO TABS
1000.0000 mg | ORAL_TABLET | Freq: Once | ORAL | Status: AC
  Administered 2013-09-24: 1000 mg via ORAL
  Filled 2013-09-24: qty 2

## 2013-09-24 MED ORDER — GUAIFENESIN 100 MG/5ML PO LIQD: 100.0000 mg | ORAL | Status: DC | PRN

## 2013-09-24 NOTE — ED Notes (Addendum)
Pt states she has been having nausea, vomiting, diarrhea, body aches, cough and fever x 1 wk. Pt states she has not checked her temp, has not had any episodes of vomiting, or diarrhea in the last 24hours, and has not taken any medications to treat her sx. Pt states she just feels bad, headache, dizziness. Pt rates pain 8/10. Pt also c/o chest pain, pt states she has hx of cp but it is worse now that she is sick.

## 2013-09-24 NOTE — Discharge Instructions (Signed)
Take Ibuprofen (Motrin) every 6-8 hours for fever and pain  °Alternate with Tylenol  °Take Tylenol every 4-6 hours as needed for fever and pain  °Follow-up with your primary care provider next week for recheck of symptoms if not improving.  °Be sure to drink plenty of fluids and rest, at least 8hrs of sleep a night, preferably more while you are sick. °Return to the ED if you cannot keep down fluids/signs of dehydration, fever not reducing with Tylenol, difficulty breathing/wheezing, stiff neck, worsening condition, or other concerns (see below)  ° ° ° °

## 2013-09-24 NOTE — ED Provider Notes (Signed)
CSN: [id number]     Arrival date & time 09/24/13  1046 History  This chart was scribed for non-physician practitioner, Noland Fordyce, PA-C working with Osvaldo Shipper, MD by Frederich Balding, ED scribe. This patient was seen in room TR07C/TR07C and the patient's care was started at 11:39 AM.   Chief Complaint  Patient presents with  . Generalized Body Aches   The history is provided by the patient. No language interpreter was used.   HPI Comments: Lydia Blair is a 28 y.o. female who presents to the Emergency Department complaining of generalized body aches, nausea, cough, dizziness and fever that started one week ago. She has had emesis but her last episode was 6 days ago. Pt has had decreased appetite. She has had chest pain with cough. Denies emesis. Pt 's son is sick with similar symptoms. Reports taking a friend's OTC cough syrup 1 x yesterday w/o relief. Denies taking any other medications for her symptoms.    History reviewed. No pertinent past medical history. Past Surgical History  Procedure Laterality Date  . Cesarean section     Family History  Problem Relation Age of Onset  . Diabetes Maternal Grandmother   . Hypertension Maternal Grandmother   . Hypertension Maternal Grandfather   . Hypertension Paternal Grandfather    History  Substance Use Topics  . Smoking status: Current Some Day Smoker    Last Attempt to Quit: 05/02/2013  . Smokeless tobacco: Not on file  . Alcohol Use: Yes   OB History   Grav Para Term Preterm Abortions TAB SAB Ect Mult Living                 Review of Systems  Constitutional: Positive for appetite change and fatigue.  Respiratory: Positive for cough.   Cardiovascular: Positive for chest pain (with cough).  Gastrointestinal: Positive for nausea. Negative for vomiting.  Musculoskeletal: Positive for myalgias.  Neurological: Positive for dizziness.  All other systems reviewed and are negative.    Allergies  Review of patient's  allergies indicates no known allergies.  Home Medications   Current Outpatient Rx  Name  Route  Sig  Dispense  Refill  . Norethindrone-Ethinyl Estradiol-Fe Biphas (LO LOESTRIN FE) 1 MG-10 MCG / 10 MCG tablet   Oral   Take 1 tablet by mouth daily.         Marland Kitchen guaiFENesin (ROBITUSSIN) 100 MG/5ML liquid   Oral   Take 5-10 mLs (100-200 mg total) by mouth every 4 (four) hours as needed for cough.   60 mL   0    BP 118/76  Pulse 90  Temp(Src) 99.7 F (37.6 C) (Oral)  Resp 22  SpO2 98%  Physical Exam  Nursing note and vitals reviewed. Constitutional: She appears well-developed and well-nourished. No distress.  HENT:  Head: Normocephalic and atraumatic.  Right Ear: Tympanic membrane and ear canal normal.  Left Ear: Tympanic membrane and ear canal normal.  Mouth/Throat: Uvula is midline. Posterior oropharyngeal erythema (mild) present. No oropharyngeal exudate or posterior oropharyngeal edema.  Eyes: Conjunctivae are normal. No scleral icterus.  Neck: Normal range of motion.  Cardiovascular: Normal rate, regular rhythm and normal heart sounds.   Pulmonary/Chest: Effort normal and breath sounds normal. No respiratory distress. She has no wheezes. She has no rales. She exhibits no tenderness.  No respiratory distress, able to speak in full sentences w/o difficulty. Lungs: CTAB  Abdominal: Soft. She exhibits no distension and no mass. There is no tenderness. There is no  rebound and no guarding.  Musculoskeletal: Normal range of motion.  Neurological: She is alert.  Skin: Skin is warm and dry. She is not diaphoretic.    ED Course  Procedures (including critical care time)  DIAGNOSTIC STUDIES: Oxygen Saturation is 98% on RA, normal by my interpretation.    COORDINATION OF CARE: 11:43 AM-Discussed treatment plan which includes chest xray with pt at bedside and pt agreed to plan.   Labs Review Labs Reviewed - No data to display Imaging Review Dg Chest 2 View  09/24/2013    CLINICAL DATA:  Cough. Congestion. Fever and shortness of breath. Hypertension. Smoker.  EXAM: CHEST  2 VIEW  COMPARISON:  DG CHEST 2 VIEW dated 05/02/2011  FINDINGS: Midline trachea. Normal heart size and mediastinal contours. No pleural effusion or pneumothorax. Clear lungs.  IMPRESSION: Normal chest.   Electronically Signed   By: Abigail Miyamoto M.D.   On: 09/24/2013 12:15    EKG Interpretation   None       MDM   1. Flu-like symptoms     Pt presenting with flu like symptoms x1 week. Reports last episode of vomiting and diarrhea was 6 days ago.  Pt appears well, non-toxic. No respiratory distress. CXR: normal.  Will tx pt symptomatically. Advised  to use acetaminophen and ibuprofen as needed for fever and pain. Encouraged rest and fluids. Advised to f/u with [facility].  Return precautions provided. Pt verbalized understanding and agreement with tx plan.   I personally performed the services described in this documentation, which was scribed in my presence. The recorded information has been reviewed and is accurate.   Noland Fordyce, PA-C 09/24/13 1240

## 2013-09-24 NOTE — ED Notes (Signed)
Discharge instructions reviewed with pt. Pt verbalized understanding.   

## 2013-09-24 NOTE — ED Notes (Signed)
C/o body aches, nausea, not feeling well for past week. Pt states shes had fevers also.

## 2013-09-24 NOTE — ED Provider Notes (Signed)
Medical screening examination/treatment/procedure(s) were performed by non-physician practitioner and as supervising physician I was immediately available for consultation/collaboration.  EKG Interpretation   None         Osvaldo Shipper, MD 09/24/13 1558

## 2013-09-27 ENCOUNTER — Other Ambulatory Visit: Payer: Self-pay | Admitting: Obstetrics

## 2013-09-30 ENCOUNTER — Encounter: Payer: Self-pay | Admitting: Obstetrics

## 2013-10-31 ENCOUNTER — Ambulatory Visit (INDEPENDENT_AMBULATORY_CARE_PROVIDER_SITE_OTHER): Payer: Medicaid Other | Admitting: Obstetrics

## 2013-10-31 ENCOUNTER — Encounter: Payer: Self-pay | Admitting: Obstetrics

## 2013-10-31 VITALS — BP 114/69 | HR 76 | Temp 97.9°F | Ht 62.0 in | Wt 167.0 lb

## 2013-10-31 DIAGNOSIS — A499 Bacterial infection, unspecified: Secondary | ICD-10-CM

## 2013-10-31 DIAGNOSIS — B9689 Other specified bacterial agents as the cause of diseases classified elsewhere: Secondary | ICD-10-CM

## 2013-10-31 DIAGNOSIS — N760 Acute vaginitis: Secondary | ICD-10-CM

## 2013-10-31 MED ORDER — METRONIDAZOLE 500 MG PO TABS: 500.0000 mg | ORAL_TABLET | Freq: Two times a day (BID) | ORAL | Status: DC

## 2013-10-31 NOTE — Progress Notes (Signed)
Subjective:     Lydia Blair is a 28 y.o. female here for a routine exam.  Current complaints: Patient in office today for problem visit. Patient states she feels like she has another bacterial infection.  Patient states she has some slightly thick, white discharge. Patient states feels like she cannot get wet when she is having sexual intercourse. Patient states she has a slight odor sometimes. Patient denies any itching, irritation, or burning. Patient states she would like to know what might be causing them. Personal health questionnaire reviewed: yes.   Gynecologic History Patient's last menstrual period was 10/01/2013. Contraception: OCP (estrogen/progesterone)   Obstetric History OB History  Gravida Para Term Preterm AB SAB TAB Ectopic Multiple Living  5 3 3  2  2   3     # Outcome Date GA Lbr Len/2nd Weight Sex Delivery Anes PTL Lv  5 TAB 2013 [redacted]w[redacted]d       N  4 TAB 2013        N  3 TRM 08/02/09 [redacted]w[redacted]d  6 lb 7 oz (2.92 kg) M LTCS EPI  Y  2 TRM 11/19/06   7 lb 6 oz (3.345 kg) F LTCS EPI  Y  1 TRM 05/31/04 [redacted]w[redacted]d  6 lb 7 oz (2.92 kg) M LTCS EPI  Y       The following portions of the patient's history were reviewed and updated as appropriate: allergies, current medications, past family history, past medical history, past social history, past surgical history and problem list.  Review of Systems Pertinent items are noted in HPI.    Objective:    General appearance: alert and no distress Abdomen: normal findings: soft, non-tender Pelvic: cervix normal in appearance, external genitalia normal, no adnexal masses or tenderness, no cervical motion tenderness, rectovaginal septum normal, uterus normal size, shape, and consistency and vagina with tin, grey discharge    Assessment:    BV   Plan:    Education reviewed: safe sex/STD prevention and management of recurrent BV. Contraception: OCP (estrogen/progesterone). Follow up in: 4 months. Flagyl Rx

## 2013-11-01 LAB — WET PREP BY MOLECULAR PROBE
CANDIDA SPECIES: NEGATIVE
Gardnerella vaginalis: POSITIVE — AB
TRICHOMONAS VAG: NEGATIVE

## 2013-11-01 LAB — HIV ANTIBODY (ROUTINE TESTING W REFLEX): HIV: NONREACTIVE

## 2013-11-01 LAB — GC/CHLAMYDIA PROBE AMP
CT PROBE, AMP APTIMA: NEGATIVE
GC PROBE AMP APTIMA: NEGATIVE

## 2013-11-01 LAB — HEPATITIS B SURFACE ANTIGEN: HEP B S AG: NEGATIVE

## 2013-11-01 LAB — HEPATITIS C ANTIBODY: HCV Ab: NEGATIVE

## 2013-11-01 LAB — RPR

## 2013-11-07 ENCOUNTER — Ambulatory Visit: Payer: Medicaid Other | Admitting: Obstetrics

## 2014-01-09 ENCOUNTER — Emergency Department (HOSPITAL_COMMUNITY)
Admission: EM | Admit: 2014-01-09 | Discharge: 2014-01-09 | Disposition: A | Discharge status: Home / Self Care | Payer: Medicaid Other | Attending: Emergency Medicine | Admitting: Emergency Medicine

## 2014-01-09 ENCOUNTER — Encounter (HOSPITAL_COMMUNITY): Payer: Self-pay | Admitting: Emergency Medicine

## 2014-01-09 DIAGNOSIS — Z79899 Other long term (current) drug therapy: Secondary | ICD-10-CM | POA: Insufficient documentation

## 2014-01-09 DIAGNOSIS — A084 Viral intestinal infection, unspecified: Secondary | ICD-10-CM

## 2014-01-09 DIAGNOSIS — A088 Other specified intestinal infections: Secondary | ICD-10-CM | POA: Insufficient documentation

## 2014-01-09 DIAGNOSIS — Z8679 Personal history of other diseases of the circulatory system: Secondary | ICD-10-CM | POA: Insufficient documentation

## 2014-01-09 DIAGNOSIS — Z3202 Encounter for pregnancy test, result negative: Secondary | ICD-10-CM | POA: Insufficient documentation

## 2014-01-09 DIAGNOSIS — F17200 Nicotine dependence, unspecified, uncomplicated: Secondary | ICD-10-CM | POA: Insufficient documentation

## 2014-01-09 HISTORY — DX: Cardiomyopathy, unspecified: I42.9

## 2014-01-09 LAB — URINALYSIS, ROUTINE W REFLEX MICROSCOPIC
Bilirubin Urine: NEGATIVE
GLUCOSE, UA: NEGATIVE mg/dL
KETONES UR: 15 mg/dL — AB
LEUKOCYTES UA: NEGATIVE
Nitrite: NEGATIVE
PH: 6 (ref 5.0–8.0)
Protein, ur: NEGATIVE mg/dL
Specific Gravity, Urine: 1.026 (ref 1.005–1.030)
Urobilinogen, UA: 0.2 mg/dL (ref 0.0–1.0)

## 2014-01-09 LAB — COMPREHENSIVE METABOLIC PANEL
ALK PHOS: 53 U/L (ref 39–117)
ALT: 17 U/L (ref 0–35)
AST: 22 U/L (ref 0–37)
Albumin: 4.2 g/dL (ref 3.5–5.2)
BUN: 6 mg/dL (ref 6–23)
CHLORIDE: 104 meq/L (ref 96–112)
CO2: 22 meq/L (ref 19–32)
Calcium: 9.3 mg/dL (ref 8.4–10.5)
Creatinine, Ser: 0.77 mg/dL (ref 0.50–1.10)
GFR calc non Af Amer: >90 mL/min (ref >90)
GLUCOSE: 94 mg/dL (ref 70–99)
POTASSIUM: 4.4 meq/L (ref 3.7–5.3)
Sodium: 140 mEq/L (ref 137–147)
Total Bilirubin: 0.3 mg/dL (ref 0.3–1.2)
Total Protein: 7.9 g/dL (ref 6.0–8.3)

## 2014-01-09 LAB — URINE MICROSCOPIC-ADD ON

## 2014-01-09 LAB — POC URINE PREG, ED: Preg Test, Ur: NEGATIVE

## 2014-01-09 LAB — CBC WITH DIFFERENTIAL/PLATELET
Basophils Absolute: 0 10*3/uL (ref 0.0–0.1)
Basophils Relative: 0 % (ref 0–1)
Eosinophils Absolute: 0.1 10*3/uL (ref 0.0–0.7)
Eosinophils Relative: 1 % (ref 0–5)
HCT: 42.7 % (ref 36.0–46.0)
Hemoglobin: 15.1 g/dL — ABNORMAL HIGH (ref 12.0–15.0)
LYMPHS ABS: 2.6 10*3/uL (ref 0.7–4.0)
Lymphocytes Relative: 32 % (ref 12–46)
MCH: 31.3 pg (ref 26.0–34.0)
MCHC: 35.4 g/dL (ref 30.0–36.0)
MCV: 88.4 fL (ref 78.0–100.0)
Monocytes Absolute: 0.7 10*3/uL (ref 0.1–1.0)
Monocytes Relative: 9 % (ref 3–12)
NEUTROS PCT: 58 % (ref 43–77)
Neutro Abs: 4.8 10*3/uL (ref 1.7–7.7)
PLATELETS: 320 10*3/uL (ref 150–400)
RBC: 4.83 MIL/uL (ref 3.87–5.11)
RDW: 12.7 % (ref 11.5–15.5)
WBC: 8.3 10*3/uL (ref 4.0–10.5)

## 2014-01-09 LAB — LIPASE, BLOOD: Lipase: 36 U/L (ref 11–59)

## 2014-01-09 LAB — POC OCCULT BLOOD, ED: Fecal Occult Bld: NEGATIVE

## 2014-01-09 LAB — I-STAT TROPONIN, ED: Troponin i, poc: 0 ng/mL (ref 0.00–0.08)

## 2014-01-09 MED ORDER — ACETAMINOPHEN 325 MG PO TABS
650.0000 mg | ORAL_TABLET | Freq: Once | ORAL | Status: AC
  Administered 2014-01-09: 650 mg via ORAL
  Filled 2014-01-09: qty 2

## 2014-01-09 MED ORDER — ONDANSETRON 4 MG PO TBDP: 4.0000 mg | ORAL_TABLET | Freq: Three times a day (TID) | ORAL | Status: DC | PRN

## 2014-01-09 MED ORDER — IBUPROFEN 800 MG PO TABS
800.0000 mg | ORAL_TABLET | Freq: Once | ORAL | Status: AC
  Administered 2014-01-09: 800 mg via ORAL
  Filled 2014-01-09: qty 1

## 2014-01-09 MED ORDER — ONDANSETRON HCL 4 MG PO TABS
4.0000 mg | ORAL_TABLET | Freq: Once | ORAL | Status: AC
  Administered 2014-01-09: 4 mg via ORAL
  Filled 2014-01-09: qty 1

## 2014-01-09 NOTE — ED Provider Notes (Signed)
CSN: [id number]     Arrival date & time 01/09/14  1033 History   First MD Initiated Contact with Patient 01/09/14 1101     Chief Complaint  Patient presents with  . Abdominal Pain     (Consider location/radiation/quality/duration/timing/severity/associated sxs/prior Treatment) Patient is a 28 y.o. female presenting with abdominal pain. The history is provided by the patient.  Abdominal Pain Pain location:  Generalized Pain quality: sharp   Pain radiates to:  Back Duration:  2 days Timing:  Intermittent Progression:  Worsening Chronicity:  New Context: sick contacts   Context: not diet changes, not recent travel and not suspicious food intake   Relieved by:  Nothing Worsened by:  Nothing tried Ineffective treatments:  None tried Associated symptoms: belching   Associated symptoms: no chest pain, no constipation, no cough, no dysuria, no hematuria and no shortness of breath     Past Medical History  Diagnosis Date  . Cardiomyopathy    Past Surgical History  Procedure Laterality Date  . Cesarean section     Family History  Problem Relation Age of Onset  . Diabetes Maternal Grandmother   . Hypertension Maternal Grandmother   . Hypertension Maternal Grandfather   . Hypertension Paternal Grandfather    History  Substance Use Topics  . Smoking status: Current Some Day Smoker    Last Attempt to Quit: 05/02/2013  . Smokeless tobacco: Never Used  . Alcohol Use: Yes   OB History   Grav Para Term Preterm Abortions TAB SAB Ect Mult Living   5 3 3  2 2    3      Review of Systems  Constitutional: Positive for activity change.       All ROS Neg except as noted in HPI  HENT: Negative for nosebleeds.   Eyes: Negative for photophobia and discharge.  Respiratory: Negative for cough, shortness of breath and wheezing.   Cardiovascular: Negative for chest pain and palpitations.  Gastrointestinal: Positive for abdominal pain. Negative for constipation and blood in stool.   Genitourinary: Negative for dysuria, frequency and hematuria.  Musculoskeletal: Negative for arthralgias, back pain and neck pain.  Skin: Negative.   Neurological: Negative for dizziness, seizures and speech difficulty.  Psychiatric/Behavioral: Negative for hallucinations and confusion.      Allergies  Vicodin  Home Medications   Prior to Admission medications   Medication Sig Start Date End Date Taking? Authorizing Provider  metroNIDAZOLE (FLAGYL) 500 MG tablet Take 1 tablet (500 mg total) by mouth 2 (two) times daily. 10/31/13   Shelly Bombard, MD  Norethindrone-Ethinyl Estradiol-Fe Biphas (LO LOESTRIN FE) 1 MG-10 MCG / 10 MCG tablet Take 1 tablet by mouth daily.    Historical Provider, MD   BP 128/77  Pulse 85  Temp(Src) 98.4 F (36.9 C) (Oral)  Resp 17  Wt 160 lb (72.576 kg)  SpO2 96%  LMP 01/05/2014 Physical Exam  Nursing note and vitals reviewed. Constitutional: She is oriented to person, place, and time. She appears well-developed and well-nourished.  Non-toxic appearance.  HENT:  Head: Normocephalic.  Right Ear: Tympanic membrane and external ear normal.  Left Ear: Tympanic membrane and external ear normal.  Eyes: EOM and lids are normal. Pupils are equal, round, and reactive to light.  Neck: Normal range of motion. Neck supple. Carotid bruit is not present.  Cardiovascular: Normal rate, regular rhythm, normal heart sounds, intact distal pulses and normal pulses.   Pulmonary/Chest: Breath sounds normal. No respiratory distress.  Abdominal: Soft. Bowel sounds are normal.  There is generalized tenderness. There is no guarding and no CVA tenderness.  Musculoskeletal: Normal range of motion.  Lymphadenopathy:       Head (right side): No submandibular adenopathy present.       Head (left side): No submandibular adenopathy present.    She has no cervical adenopathy.  Neurological: She is alert and oriented to person, place, and time. She has normal strength. No cranial  nerve deficit or sensory deficit.  Skin: Skin is warm and dry.  Psychiatric: She has a normal mood and affect. Her speech is normal.    ED Course  Procedures (including critical care time) Labs Review Labs Reviewed  CBC WITH DIFFERENTIAL  COMPREHENSIVE METABOLIC PANEL  LIPASE, BLOOD  URINALYSIS, ROUTINE W REFLEX MICROSCOPIC  I-STAT TROPOININ, ED  POC URINE PREG, ED    Imaging Review No results found.   EKG Interpretation None      MDM Stool for cold blood was negative. I-STAT troponin I was 0. Urine pregnancy was negative. Urinalysis showed a hazy L. specimen with a specific gravity 1.026 there was a large amount of hemoglobin on the dip stick, with 15 mg per decaliter ketones present. The nitrates and leukocyte esterase were negative, the white blood cells on the microscopic view was 0-2 and there were only a few bacteria present. The complete blood count is well within normal limits. The comprehensive metabolic panel was normal . The lipase is normal at 36.  Examination, review of labs, review of history, suggest the patient probably has a viral gastroenteritis. Patient will be treated with Zofran ODT for nausea. She is advised to use clear liquids for the next 24-48 hours. She is advised to followup with her primary physician for recheck.    Final diagnoses:  None    **I have reviewed nursing notes, vital signs, and all appropriate lab and imaging results for this patient.Lenox Ahr, PA-C 01/09/14 [phone]

## 2014-01-09 NOTE — Discharge Instructions (Signed)
Your test are negative for an acute event at this time. Suspect that you have a viral illness (viral gastroenteritis). Please use Zofran for nausea or vomiting. Please increase fluids. May use Tylenol or ibuprofen for soreness. Please wash hands frequently. Please see your [facility] access MD for additional evaluation and management. Viral Infections A virus is a type of germ. Viruses can cause:  Minor sore throats.  Aches and pains.  Headaches.  Runny nose.  Rashes.  Watery eyes.  Tiredness.  Coughs.  Loss of appetite.  Feeling sick to your stomach (nausea).  Throwing up (vomiting).  Watery poop (diarrhea). HOME CARE   Only take medicines as told by your doctor.  Drink enough water and fluids to keep your pee (urine) clear or pale yellow. Sports drinks are a good choice.  Get plenty of rest and eat healthy. Soups and broths with crackers or rice are fine. GET HELP RIGHT AWAY IF:   You have a very bad headache.  You have shortness of breath.  You have chest pain or neck pain.  You have an unusual rash.  You cannot stop throwing up.  You have watery poop that does not stop.  You cannot keep fluids down.  You or your child has a temperature by mouth above 102 F (38.9 C), not controlled by medicine.  Your baby is older than 3 months with a rectal temperature of 102 F (38.9 C) or higher.  Your baby is 45 months old or younger with a rectal temperature of 100.4 F (38 C) or higher. MAKE SURE YOU:   Understand these instructions.  Will watch this condition.  Will get help right away if you are not doing well or get worse. Document Released: 08/21/2008 Document Revised: 12/01/2011 Document Reviewed: 01/14/2011 [facility] Patient Information 2014 [place], [place].

## 2014-01-09 NOTE — ED Notes (Signed)
PT states severe pains during the nite, feels weak and faint.  Pt states stool has changed three different times, reports brown black stool.  Mid abdominal diffuse pain. Pt reports nausea

## 2014-01-11 NOTE — ED Provider Notes (Signed)
Medical screening examination/treatment/procedure(s) were performed by non-physician practitioner and as supervising physician I was immediately available for consultation/collaboration.   EKG Interpretation None        Alfonzo Feller, DO 01/11/14 [phone]

## 2014-02-07 ENCOUNTER — Ambulatory Visit (INDEPENDENT_AMBULATORY_CARE_PROVIDER_SITE_OTHER): Payer: Medicaid Other | Admitting: Obstetrics

## 2014-02-07 ENCOUNTER — Encounter: Payer: Self-pay | Admitting: Obstetrics

## 2014-02-07 VITALS — BP 113/75 | HR 63 | Temp 98.9°F | Ht 62.0 in | Wt 161.0 lb

## 2014-02-07 DIAGNOSIS — N760 Acute vaginitis: Secondary | ICD-10-CM

## 2014-02-07 DIAGNOSIS — Z113 Encounter for screening for infections with a predominantly sexual mode of transmission: Secondary | ICD-10-CM

## 2014-02-07 DIAGNOSIS — A499 Bacterial infection, unspecified: Secondary | ICD-10-CM

## 2014-02-07 DIAGNOSIS — B9689 Other specified bacterial agents as the cause of diseases classified elsewhere: Secondary | ICD-10-CM

## 2014-02-08 ENCOUNTER — Encounter: Payer: Self-pay | Admitting: Obstetrics

## 2014-02-08 DIAGNOSIS — N760 Acute vaginitis: Principal | ICD-10-CM | POA: Insufficient documentation

## 2014-02-08 DIAGNOSIS — B9689 Other specified bacterial agents as the cause of diseases classified elsewhere: Secondary | ICD-10-CM | POA: Insufficient documentation

## 2014-02-08 LAB — RPR

## 2014-02-08 LAB — WET PREP BY MOLECULAR PROBE
CANDIDA SPECIES: NEGATIVE
GARDNERELLA VAGINALIS: POSITIVE — AB
TRICHOMONAS VAG: NEGATIVE

## 2014-02-08 LAB — HEPATITIS C ANTIBODY: HCV AB: NEGATIVE

## 2014-02-08 LAB — GC/CHLAMYDIA PROBE AMP
CT PROBE, AMP APTIMA: NEGATIVE
GC Probe RNA: NEGATIVE

## 2014-02-08 LAB — HIV ANTIBODY (ROUTINE TESTING W REFLEX): HIV 1&2 Ab, 4th Generation: NONREACTIVE

## 2014-02-08 LAB — HEPATITIS B SURFACE ANTIGEN: Hepatitis B Surface Ag: NEGATIVE

## 2014-02-08 NOTE — Progress Notes (Signed)
Patient ID: Lydia Blair, female   DOB: 10/13/1985, 28 y.o.   MRN: [id number]  Chief Complaint  Patient presents with  . Problem    STD Check     HPI Lydia Blair is a 28 y.o. female.  Vaginal discharge, thin grey and malodorous.  HPI  Past Medical History  Diagnosis Date  . Cardiomyopathy     Past Surgical History  Procedure Laterality Date  . Cesarean section      Family History  Problem Relation Age of Onset  . Diabetes Maternal Grandmother   . Hypertension Maternal Grandmother   . Hypertension Maternal Grandfather   . Hypertension Paternal Grandfather     Social History History  Substance Use Topics  . Smoking status: Current Some Day Smoker    Last Attempt to Quit: 05/02/2013  . Smokeless tobacco: Never Used  . Alcohol Use: Yes    Allergies  Allergen Reactions  . Vicodin [Hydrocodone-Acetaminophen] Other (See Comments)    No current outpatient prescriptions on file.   No current facility-administered medications for this visit.    Review of Systems Review of Systems Constitutional: negative for fatigue and weight loss Respiratory: negative for cough and wheezing Cardiovascular: negative for chest pain, fatigue and palpitations Gastrointestinal: negative for abdominal pain and change in bowel habits Genitourinary:negative Integument/breast: negative for nipple discharge Musculoskeletal:negative for myalgias Neurological: negative for gait problems and tremors Behavioral/Psych: negative for abusive relationship, depression Endocrine: negative for temperature intolerance     Blood pressure 113/75, pulse 63, temperature 98.9 F (37.2 C), height 5\' 2"  (1.575 m), weight 161 lb (73.029 kg), last menstrual period 01/09/2014.  Physical Exam Physical Exam General:   alert  Skin:   no rash or abnormalities  Lungs:   clear to auscultation bilaterally  Heart:   regular rate and rhythm, S1, S2 normal, no murmur, click, rub or gallop  Breasts:    normal without suspicious masses, skin or nipple changes or axillary nodes  Abdomen:  normal findings: no organomegaly, soft, non-tender and no hernia  Pelvis:  External genitalia: normal general appearance Urinary system: urethral meatus normal and bladder without fullness, nontender Vaginal: normal without tenderness, induration or masses Cervix: normal appearance Adnexa: normal bimanual exam Uterus: anteverted and non-tender, normal size      Data Reviewed Labs  Assessment    Probable BV.    Plan  Will await Affirm test results.      Orders Placed This Encounter  Procedures  . WET PREP BY MOLECULAR PROBE  . GC/Chlamydia Probe Amp  . HIV antibody  . Hepatitis B surface antigen  . RPR  . Hepatitis C antibody   No orders of the defined types were placed in this encounter.        Shelly Bombard 02/08/2014, 3:25 AM

## 2014-02-14 ENCOUNTER — Telehealth: Payer: Self-pay | Admitting: *Deleted

## 2014-02-14 NOTE — Telephone Encounter (Signed)
Request refill Metronidazole 500mg 

## 2014-02-21 ENCOUNTER — Telehealth: Payer: Self-pay | Admitting: *Deleted

## 2014-02-21 DIAGNOSIS — N760 Acute vaginitis: Principal | ICD-10-CM

## 2014-02-21 DIAGNOSIS — B9689 Other specified bacterial agents as the cause of diseases classified elsewhere: Secondary | ICD-10-CM

## 2014-02-21 MED ORDER — METRONIDAZOLE 500 MG PO TABS: 500.0000 mg | ORAL_TABLET | Freq: Two times a day (BID) | ORAL | Status: DC

## 2014-02-21 NOTE — Telephone Encounter (Signed)
OK for refill.

## 2014-02-21 NOTE — Telephone Encounter (Signed)
Pharmacy contact office on behalf of the patient requesting a refill on Metronidazole 500 mg #14 po BID

## 2014-03-02 ENCOUNTER — Ambulatory Visit: Payer: Medicaid Other | Admitting: Obstetrics

## 2014-03-29 ENCOUNTER — Other Ambulatory Visit (INDEPENDENT_AMBULATORY_CARE_PROVIDER_SITE_OTHER): Payer: Medicaid Other

## 2014-06-06 ENCOUNTER — Telehealth: Payer: Self-pay | Admitting: *Deleted

## 2014-06-06 NOTE — Telephone Encounter (Signed)
Pharmacy faxed over a refill request for Metronidazole 500 MG Tablets Qty: 14  Please advise.

## 2014-06-07 ENCOUNTER — Other Ambulatory Visit: Payer: Self-pay | Admitting: *Deleted

## 2014-06-07 DIAGNOSIS — N760 Acute vaginitis: Principal | ICD-10-CM

## 2014-06-07 DIAGNOSIS — B9689 Other specified bacterial agents as the cause of diseases classified elsewhere: Secondary | ICD-10-CM

## 2014-06-07 MED ORDER — METRONIDAZOLE 500 MG PO TABS: 500.0000 mg | ORAL_TABLET | Freq: Two times a day (BID) | ORAL | Status: DC

## 2014-06-07 NOTE — Telephone Encounter (Signed)
OK for refill.

## 2014-06-07 NOTE — Telephone Encounter (Signed)
Rx sent to pharmacy   

## 2014-07-07 ENCOUNTER — Other Ambulatory Visit: Payer: Self-pay

## 2014-07-20 ENCOUNTER — Ambulatory Visit: Payer: Medicaid Other | Admitting: Obstetrics

## 2014-07-24 ENCOUNTER — Encounter: Payer: Self-pay | Admitting: Obstetrics

## 2014-08-19 ENCOUNTER — Other Ambulatory Visit: Payer: Self-pay | Admitting: Obstetrics

## 2015-03-01 ENCOUNTER — Ambulatory Visit (INDEPENDENT_AMBULATORY_CARE_PROVIDER_SITE_OTHER): Payer: Medicaid Other | Admitting: Certified Nurse Midwife

## 2015-03-01 ENCOUNTER — Encounter: Payer: Self-pay | Admitting: Certified Nurse Midwife

## 2015-03-01 VITALS — BP 116/76 | HR 90 | Temp 99.0°F | Wt 157.0 lb

## 2015-03-01 DIAGNOSIS — G43111 Migraine with aura, intractable, with status migrainosus: Secondary | ICD-10-CM

## 2015-03-01 DIAGNOSIS — K5901 Slow transit constipation: Secondary | ICD-10-CM

## 2015-03-01 DIAGNOSIS — Z3490 Encounter for supervision of normal pregnancy, unspecified, unspecified trimester: Secondary | ICD-10-CM | POA: Diagnosis not present

## 2015-03-01 DIAGNOSIS — Z8679 Personal history of other diseases of the circulatory system: Secondary | ICD-10-CM

## 2015-03-01 DIAGNOSIS — Z3687 Encounter for antenatal screening for uncertain dates: Secondary | ICD-10-CM

## 2015-03-01 DIAGNOSIS — O219 Vomiting of pregnancy, unspecified: Secondary | ICD-10-CM

## 2015-03-01 DIAGNOSIS — Z36 Encounter for antenatal screening of mother: Secondary | ICD-10-CM

## 2015-03-01 LAB — POCT URINALYSIS DIPSTICK
Bilirubin, UA: NEGATIVE
Blood, UA: NEGATIVE
Glucose, UA: NEGATIVE
Ketones, UA: NEGATIVE
Leukocytes, UA: NEGATIVE
Nitrite, UA: NEGATIVE
PH UA: 7
Protein, UA: NEGATIVE
SPEC GRAV UA: 1.015
Urobilinogen, UA: NEGATIVE

## 2015-03-01 MED ORDER — BUTALBITAL-APAP-CAFFEINE 50-325-40 MG PO TABS: 1.0000 | ORAL_TABLET | Freq: Four times a day (QID) | ORAL | Status: DC | PRN

## 2015-03-01 MED ORDER — DOXYLAMINE-PYRIDOXINE 10-10 MG PO TBEC: DELAYED_RELEASE_TABLET | ORAL | Status: DC

## 2015-03-01 MED ORDER — DOCUSATE SODIUM 100 MG PO CAPS: 100.0000 mg | ORAL_CAPSULE | Freq: Two times a day (BID) | ORAL | Status: DC

## 2015-03-01 NOTE — Patient Instructions (Signed)
First Trimester of Pregnancy The first trimester of pregnancy is from week 1 until the end of week 12 (months 1 through 3). A week after a sperm fertilizes an egg, the egg will implant on the wall of the uterus. This embryo will begin to develop into a baby. Genes from you and your partner are forming the baby. The female genes determine whether the baby is a boy or a girl. At 6-8 weeks, the eyes and face are formed, and the heartbeat can be seen on ultrasound. At the end of 12 weeks, all the baby's organs are formed.  Now that you are pregnant, you will want to do everything you can to have a healthy baby. Two of the most important things are to get good prenatal care and to follow your health care provider's instructions. Prenatal care is all the medical care you receive before the baby's birth. This care will help prevent, find, and treat any problems during the pregnancy and childbirth. BODY CHANGES Your body goes through many changes during pregnancy. The changes vary from woman to woman.   You may gain or lose a couple of pounds at first.  You may feel sick to your stomach (nauseous) and throw up (vomit). If the vomiting is uncontrollable, call your health care provider.  You may tire easily.  You may develop headaches that can be relieved by medicines approved by your health care provider.  You may urinate more often. Painful urination may mean you have a bladder infection.  You may develop heartburn as a result of your pregnancy.  You may develop constipation because certain hormones are causing the muscles that push waste through your intestines to slow down.  You may develop hemorrhoids or swollen, bulging veins (varicose veins).  Your breasts may begin to grow larger and become tender. Your nipples may stick out more, and the tissue that surrounds them (areola) may become darker.  Your gums may bleed and may be sensitive to brushing and flossing.  Dark spots or blotches (chloasma,  mask of pregnancy) may develop on your face. This will likely fade after the baby is born.  Your menstrual periods will stop.  You may have a loss of appetite.  You may develop cravings for certain kinds of food.  You may have changes in your emotions from day to day, such as being excited to be pregnant or being concerned that something may go wrong with the pregnancy and baby.  You may have more vivid and strange dreams.  You may have changes in your hair. These can include thickening of your hair, rapid growth, and changes in texture. Some women also have hair loss during or after pregnancy, or hair that feels dry or thin. Your hair will most likely return to normal after your baby is born. WHAT TO EXPECT AT YOUR PRENATAL VISITS During a routine prenatal visit: 1. You will be weighed to make sure you and the baby are growing normally. 2. Your blood pressure will be taken. 3. Your abdomen will be measured to track your baby's growth. 4. The fetal heartbeat will be listened to starting around week 10 or 12 of your pregnancy. 5. Test results from any previous visits will be discussed. Your health care provider may ask you:  How you are feeling.  If you are feeling the baby move.  If you have had any abnormal symptoms, such as leaking fluid, bleeding, severe headaches, or abdominal cramping.  If you have any questions. Other tests  that may be performed during your first trimester include:  Blood tests to find your blood type and to check for the presence of any previous infections. They will also be used to check for low iron levels (anemia) and Rh antibodies. Later in the pregnancy, blood tests for diabetes will be done along with other tests if problems develop.  Urine tests to check for infections, diabetes, or protein in the urine.  An ultrasound to confirm the proper growth and development of the baby.  An amniocentesis to check for possible genetic problems.  Fetal screens  for spina bifida and Down syndrome.  You may need other tests to make sure you and the baby are doing well. HOME CARE INSTRUCTIONS  Medicines  Follow your health care provider's instructions regarding medicine use. Specific medicines may be either safe or unsafe to take during pregnancy.  Take your prenatal vitamins as directed.  If you develop constipation, try taking a stool softener if your health care provider approves. Diet  Eat regular, well-balanced meals. Choose a variety of foods, such as meat or vegetable-based protein, fish, milk and low-fat dairy products, vegetables, fruits, and whole grain breads and cereals. Your health care provider will help you determine the amount of weight gain that is right for you.  Avoid raw meat and uncooked cheese. These carry germs that can cause birth defects in the baby.  Eating four or five small meals rather than three large meals a day may help relieve nausea and vomiting. If you start to feel nauseous, eating a few soda crackers can be helpful. Drinking liquids between meals instead of during meals also seems to help nausea and vomiting.  If you develop constipation, eat more high-fiber foods, such as fresh vegetables or fruit and whole grains. Drink enough fluids to keep your urine clear or pale yellow. Activity and Exercise  Exercise only as directed by your health care provider. Exercising will help you:  Control your weight.  Stay in shape.  Be prepared for labor and delivery.  Experiencing pain or cramping in the lower abdomen or low back is a good sign that you should stop exercising. Check with your health care provider before continuing normal exercises.  Try to avoid standing for long periods of time. Move your legs often if you must stand in one place for a long time.  Avoid heavy lifting.  Wear low-heeled shoes, and practice good posture.  You may continue to have sex unless your health care provider directs you  otherwise. Relief of Pain or Discomfort  Wear a good support bra for breast tenderness.   Take warm sitz baths to soothe any pain or discomfort caused by hemorrhoids. Use hemorrhoid cream if your health care provider approves.   Rest with your legs elevated if you have leg cramps or low back pain.  If you develop varicose veins in your legs, wear support hose. Elevate your feet for 15 minutes, 3-4 times a day. Limit salt in your diet. Prenatal Care  Schedule your prenatal visits by the twelfth week of pregnancy. They are usually scheduled monthly at first, then more often in the last 2 months before delivery.  Write down your questions. Take them to your prenatal visits.  Keep all your prenatal visits as directed by your health care provider. Safety  Wear your seat belt at all times when driving.  Make a list of emergency phone numbers, including numbers for family, friends, the hospital, and police and fire departments. General Tips  Ask your health care provider for a referral to a local prenatal education class. Begin classes no later than at the beginning of month 6 of your pregnancy.  Ask for help if you have counseling or nutritional needs during pregnancy. Your health care provider can offer advice or refer you to specialists for help with various needs.  Do not use hot tubs, steam rooms, or saunas.  Do not douche or use tampons or scented sanitary pads.  Do not cross your legs for long periods of time.  Avoid cat litter boxes and soil used by cats. These carry germs that can cause birth defects in the baby and possibly loss of the fetus by miscarriage or stillbirth.  Avoid all smoking, herbs, alcohol, and medicines not prescribed by your health care provider. Chemicals in these affect the formation and growth of the baby.  Schedule a dentist appointment. At home, brush your teeth with a soft toothbrush and be gentle when you floss. SEEK MEDICAL CARE IF:   You have  dizziness.  You have mild pelvic cramps, pelvic pressure, or nagging pain in the abdominal area.  You have persistent nausea, vomiting, or diarrhea.  You have a bad smelling vaginal discharge.  You have pain with urination.  You notice increased swelling in your face, hands, legs, or ankles. SEEK IMMEDIATE MEDICAL CARE IF:   You have a fever.  You are leaking fluid from your vagina.  You have spotting or bleeding from your vagina.  You have severe abdominal cramping or pain.  You have rapid weight gain or loss.  You vomit blood or material that looks like coffee grounds.  You are exposed to [place] measles and have never had them.  You are exposed to fifth disease or chickenpox.  You develop a severe headache.  You have shortness of breath.  You have any kind of trauma, such as from a fall or a car accident. Document Released: 09/02/2001 Document Revised: 01/23/2014 Document Reviewed: 07/19/2013 [facility] Patient Information 2015 [place], [place]. This information is not intended to replace advice given to you by your health care provider. Make sure you discuss any questions you have with your health care provider. First Trimester of Pregnancy The first trimester of pregnancy is from week 1 until the end of week 12 (months 1 through 3). A week after a sperm fertilizes an egg, the egg will implant on the wall of the uterus. This embryo will begin to develop into a baby. Genes from you and your partner are forming the baby. The female genes determine whether the baby is a boy or a girl. At 6-8 weeks, the eyes and face are formed, and the heartbeat can be seen on ultrasound. At the end of 12 weeks, all the baby's organs are formed.  Now that you are pregnant, you will want to do everything you can to have a healthy baby. Two of the most important things are to get good prenatal care and to follow your health care provider's instructions. Prenatal care is all the medical care you receive  before the baby's birth. This care will help prevent, find, and treat any problems during the pregnancy and childbirth. BODY CHANGES Your body goes through many changes during pregnancy. The changes vary from woman to woman.   You may gain or lose a couple of pounds at first.  You may feel sick to your stomach (nauseous) and throw up (vomit). If the vomiting is uncontrollable, call your health care provider.  You may  tire easily.  You may develop headaches that can be relieved by medicines approved by your health care provider.  You may urinate more often. Painful urination may mean you have a bladder infection.  You may develop heartburn as a result of your pregnancy.  You may develop constipation because certain hormones are causing the muscles that push waste through your intestines to slow down.  You may develop hemorrhoids or swollen, bulging veins (varicose veins).  Your breasts may begin to grow larger and become tender. Your nipples may stick out more, and the tissue that surrounds them (areola) may become darker.  Your gums may bleed and may be sensitive to brushing and flossing.  Dark spots or blotches (chloasma, mask of pregnancy) may develop on your face. This will likely fade after the baby is born.  Your menstrual periods will stop.  You may have a loss of appetite.  You may develop cravings for certain kinds of food.  You may have changes in your emotions from day to day, such as being excited to be pregnant or being concerned that something may go wrong with the pregnancy and baby.  You may have more vivid and strange dreams.  You may have changes in your hair. These can include thickening of your hair, rapid growth, and changes in texture. Some women also have hair loss during or after pregnancy, or hair that feels dry or thin. Your hair will most likely return to normal after your baby is born. WHAT TO EXPECT AT YOUR PRENATAL VISITS During a routine prenatal  visit: 6. You will be weighed to make sure you and the baby are growing normally. 7. Your blood pressure will be taken. 8. Your abdomen will be measured to track your baby's growth. 9. The fetal heartbeat will be listened to starting around week 10 or 12 of your pregnancy. 10. Test results from any previous visits will be discussed. Your health care provider may ask you:  How you are feeling.  If you are feeling the baby move.  If you have had any abnormal symptoms, such as leaking fluid, bleeding, severe headaches, or abdominal cramping.  If you have any questions. Other tests that may be performed during your first trimester include:  Blood tests to find your blood type and to check for the presence of any previous infections. They will also be used to check for low iron levels (anemia) and Rh antibodies. Later in the pregnancy, blood tests for diabetes will be done along with other tests if problems develop.  Urine tests to check for infections, diabetes, or protein in the urine.  An ultrasound to confirm the proper growth and development of the baby.  An amniocentesis to check for possible genetic problems.  Fetal screens for spina bifida and Down syndrome.  You may need other tests to make sure you and the baby are doing well. HOME CARE INSTRUCTIONS  Medicines  Follow your health care provider's instructions regarding medicine use. Specific medicines may be either safe or unsafe to take during pregnancy.  Take your prenatal vitamins as directed.  If you develop constipation, try taking a stool softener if your health care provider approves. Diet  Eat regular, well-balanced meals. Choose a variety of foods, such as meat or vegetable-based protein, fish, milk and low-fat dairy products, vegetables, fruits, and whole grain breads and cereals. Your health care provider will help you determine the amount of weight gain that is right for you.  Avoid raw meat and uncooked  cheese. These carry germs that can cause birth defects in the baby.  Eating four or five small meals rather than three large meals a day may help relieve nausea and vomiting. If you start to feel nauseous, eating a few soda crackers can be helpful. Drinking liquids between meals instead of during meals also seems to help nausea and vomiting.  If you develop constipation, eat more high-fiber foods, such as fresh vegetables or fruit and whole grains. Drink enough fluids to keep your urine clear or pale yellow. Activity and Exercise  Exercise only as directed by your health care provider. Exercising will help you:  Control your weight.  Stay in shape.  Be prepared for labor and delivery.  Experiencing pain or cramping in the lower abdomen or low back is a good sign that you should stop exercising. Check with your health care provider before continuing normal exercises.  Try to avoid standing for long periods of time. Move your legs often if you must stand in one place for a long time.  Avoid heavy lifting.  Wear low-heeled shoes, and practice good posture.  You may continue to have sex unless your health care provider directs you otherwise. Relief of Pain or Discomfort  Wear a good support bra for breast tenderness.   Take warm sitz baths to soothe any pain or discomfort caused by hemorrhoids. Use hemorrhoid cream if your health care provider approves.   Rest with your legs elevated if you have leg cramps or low back pain.  If you develop varicose veins in your legs, wear support hose. Elevate your feet for 15 minutes, 3-4 times a day. Limit salt in your diet. Prenatal Care  Schedule your prenatal visits by the twelfth week of pregnancy. They are usually scheduled monthly at first, then more often in the last 2 months before delivery.  Write down your questions. Take them to your prenatal visits.  Keep all your prenatal visits as directed by your health care  provider. Safety  Wear your seat belt at all times when driving.  Make a list of emergency phone numbers, including numbers for family, friends, the hospital, and police and fire departments. General Tips  Ask your health care provider for a referral to a local prenatal education class. Begin classes no later than at the beginning of month 6 of your pregnancy.  Ask for help if you have counseling or nutritional needs during pregnancy. Your health care provider can offer advice or refer you to specialists for help with various needs.  Do not use hot tubs, steam rooms, or saunas.  Do not douche or use tampons or scented sanitary pads.  Do not cross your legs for long periods of time.  Avoid cat litter boxes and soil used by cats. These carry germs that can cause birth defects in the baby and possibly loss of the fetus by miscarriage or stillbirth.  Avoid all smoking, herbs, alcohol, and medicines not prescribed by your health care provider. Chemicals in these affect the formation and growth of the baby.  Schedule a dentist appointment. At home, brush your teeth with a soft toothbrush and be gentle when you floss. SEEK MEDICAL CARE IF:   You have dizziness.  You have mild pelvic cramps, pelvic pressure, or nagging pain in the abdominal area.  You have persistent nausea, vomiting, or diarrhea.  You have a bad smelling vaginal discharge.  You have pain with urination.  You notice increased swelling in your face, hands, legs, or ankles.  SEEK IMMEDIATE MEDICAL CARE IF:   You have a fever.  You are leaking fluid from your vagina.  You have spotting or bleeding from your vagina.  You have severe abdominal cramping or pain.  You have rapid weight gain or loss.  You vomit blood or material that looks like coffee grounds.  You are exposed to [place] measles and have never had them.  You are exposed to fifth disease or chickenpox.  You develop a severe headache.  You have  shortness of breath.  You have any kind of trauma, such as from a fall or a car accident. Document Released: 09/02/2001 Document Revised: 01/23/2014 Document Reviewed: 07/19/2013 [facility] Patient Information 2015 [place], [place]. This information is not intended to replace advice given to you by your health care provider. Make sure you discuss any questions you have with your health care provider. Nutrition for the New Mother  A new mother needs good health and nutrition so she can have energy to take care of a new baby. Whether a mother breastfeeds or formula feeds the baby, it is important to have a well-balanced diet. Foods from all the food groups should be chosen to meet the new mother's energy needs and to give her the nutrients needed for repair and healing.  A HEALTHY EATING PLAN The My Pyramid plan for Moms outlines what you should eat to help you and your baby stay healthy. The energy and amount of food you need depends on whether or not you are breastfeeding. If you are breastfeeding you will need more nutrients. If you choose not to breastfeed, your nutrition goal should be to return to a healthy weight. Limiting calories may be needed if you are not breastfeeding.  HOME CARE INSTRUCTIONS   For a personal plan based on your unique needs, see your Registered Dietitian or visit [email or web address]  Eat a variety of foods. The plan below will help guide you. The following chart has a suggested daily meal plan from the My Pyramid for Moms.  Eat a variety of fruits and vegetables.  Eat more dark green and orange vegetables and cooked dried beans.  Make half your grains whole grains. Choose whole instead of refined grains.  Choose low-fat or lean meats and poultry.  Choose low-fat or fat-free dairy products like milk, cheese, or yogurt. Fruits 11. Breastfeeding: 2 cups 12. Non-Breastfeeding: 2 cups 13. What Counts as a serving? 1. 1 cup of fruit or juice. 2.  cup dried  fruit. Vegetables  Breastfeeding: 3 cups  Non-Breastfeeding: 2  cups  What Counts as a serving?  1 cup raw or cooked vegetables.  Juice or 2 cups raw leafy vegetables. Grains  Breastfeeding: 8 oz  Non-Breastfeeding: 6 oz  What Counts as a serving?  1 slice bread.  1 oz ready-to-eat cereal.   cup cooked pasta, rice, or cereal. Meat and Beans  Breastfeeding: 6  oz  Non-Breastfeeding: 5  oz  What Counts as a serving?  1 oz lean meat, poultry, or fish   cup cooked dry beans   oz nuts or 1 egg  1 tbs peanut butter Milk  Breastfeeding: 3 cups  Non-Breastfeeding: 3 cups  What Counts as a serving?  1 cup milk.  8 oz yogurt.  1  oz cheese.  2 oz processed cheese. TIPS FOR THE BREASTFEEDING MOM  Rapid weight loss is not suggested when you are breastfeeding. By simply breastfeeding, you will be able to lose the weight gained during your pregnancy. Your caregiver  can keep track of your weight and tell you if your weight loss is appropriate.  Be sure to drink fluids. You may notice that you are thirstier than usual. A suggestion is to drink a glass of water or other beverage whenever you breastfeed.  Avoid alcohol as it can be passed into your breast milk.  Limit caffeine drinks to no more than 2 to 3 cups per day.  You may need to keep taking your prenatal vitamin while you are breastfeeding. Talk with your caregiver about taking a vitamin or supplement. RETURING TO A HEALTHY WEIGHT  The My Pyramid Plan for Moms will help you return to a healthy weight. It will also provide the nutrients you need.  You may need to limit "empty" calories. These include:  High fat foods like fried foods, fatty meats, fast food, butter, and mayonnaise.  High sugar foods like sodas, jelly, candy, and sweets.  Be physically active. Include 30 minutes of exercise or more each day. Choose an activity you like such as walking, swimming, biking, or aerobics. Check with  your caregiver before you start to exercise. Document Released: 12/16/2007 Document Revised: 12/01/2011 Document Reviewed: 12/16/2007 [facility] Patient Information 2015 [place], [place]. This information is not intended to replace advice given to you by your health care provider. Make sure you discuss any questions you have with your health care provider. How a Baby Grows During Pregnancy Pregnancy begins when the female's sperm enters the female's egg. This happens in the fallopian tube and is called fertilization. The fertilized egg is called an embryo until it reaches 9 weeks from the time of fertilization. From 9 weeks until birth it is called a fetus. The fertilized egg moves down the tube into the uterus and attaches to the inside lining of the uterus.  The pregnant woman is responsible for the growth of the embryo/fetus by supplying nourishment and oxygen through the blood stream and placenta to the developing fetus. The uterus becomes larger and pops out from the abdomen more and more as the fetus develops and grows. A normal pregnancy lasts 280 days, with a range of 259 to 294 days, or 40 weeks. The pregnancy is divided up into three trimesters:  First trimester - 0 to 13 weeks.  Second trimester - 14 to 27 weeks.  Third trimester - 28 to 40 weeks. The day your baby is supposed to be born is called estimated date of confinement [facility]) or estimated date of delivery (EDD). GROWTH OF THE BABY MONTH BY MONTH 14. First Month: The fertilized egg attaches to the inside of the uterus and certain cells will form the placenta and others will develop into the fetus. The arms, legs, brain, spinal cord, lungs, and heart begin to develop. At the end of the first month the heart begins to beat. The embryo weighs less than an ounce and is  inch long. 15. Second Month: The bones can be seen, the inner ear, eye lids, hands and feet form and genitals develop. By the end of 8 weeks, all of the major organs are  developing. The fetus now weighs less than an ounce and is one inch (2.54 cm) long. 16. Third Month: Teeth buds appear, all the internal organs are forming, bones and muscles begin to grow, the spine can flex and the skin is transparent. Finger and toe nails begin to form, the hands develop faster than the feet and the arms are longer than the legs at this point. The fetus weighs a  little more than an ounce (0.03 kg) and is 3 inches (8.89cm) long. 17. Fourth Month: The placenta is completely formed. The external sex organs, neck, outer ear, eyebrows, eyelids and fingernails are formed. The fetus can hear, swallow, flex its arms and legs and the kidney begins to produce urine. The skin is covered with a white waxy coating (vernix) and very thin hair (lanugo) is present. The fetus weighs 5 ounces (0.14kg) and is 6 to 7 inches (16.51cm) long. 18. Fifth Month: The fetus moves around more and can be felt for the first time (called quickening), sleeps and wakes up at times, may begin to suck its finger and the nails grow to the end of the fingers. The gallbladder is now functioning and helps to digest the nutrients, eggs are formed in the female and the testicles begin to drop down from the abdomen to the scrotum in the female. The fetus weighs  to 1 pound (0.45kg) and is 10 inches (25.4cm) long. 19. Sixth Month: The lungs are formed but the fetus does not breath yet. The eyes open, the brain develops more quickly at this time, one can detect finger and toe prints and thicker hair grows. The fetus weighs 1 to 1 pounds (0.68kg) and is 12 inches (30.48cm) long. 20. Seventh Month: The fetus can hear and respond to sounds, kicks and stretches and can sense changes in light. The fetus weighs 2 to 2 pounds (1.13kg) and is 14 inches (35.56cm) long. 21. Eight Month: All organs and body systems are fully developed and functioning. The bones get harder, taste buds develop and can taste sweet and sour flavors and the fetus  may hiccup now. Different parts of the brain are developing and the skull remains soft for the brain to grow. The fetus weighs 5 pounds (2.27kg) and is 18 inches (45.75cm) long. 22. Ninth Month: The fetus gains about a half a pound a week, the lungs are fully developed, patterns of sleep develop and the head moves down into the bottom of the uterus called vertex. If the buttocks moves into the bottom of the uterus, it is called a breech. The fetus weighs 6 to 9 pounds (2.72 to 4.08kg) and is 20 inches (50.8cm) long. You should be informed about your pregnancy, yourself and how the baby is developing as much as possible. Being informed helps you to enjoy this experience. It also gives you the sense to feel if something is not going right and when to ask questions. Talk to your caregiver when you have questions about your baby or your own body. Document Released: 02/25/2008 Document Revised: 12/01/2011 Document Reviewed: 02/25/2008 [facility] Patient Information 2015 [place], [place]. This information is not intended to replace advice given to you by your health care provider. Make sure you discuss any questions you have with your health care provider.

## 2015-03-01 NOTE — Progress Notes (Signed)
Subjective:    Lydia Blair is being seen today for her first obstetrical visit.  This is not a planned pregnancy. She is at [redacted]w[redacted]d gestation. Her obstetrical history is significant for previous C-sections X3. Relationship with FOB: significant other, living together. Patient does intend to breast feed. Pregnancy history fully reviewed.  Employed as a [employer]arry manager.  Quit smoking when she found out she was pregnant.   The information documented in the HPI was reviewed and verified.  Menstrual History: OB History    Gravida Para Term Preterm AB TAB SAB Ectopic Multiple Living   6 3 3  2 2    3       Menarche age: 74  Patient's last menstrual period was 12/17/2014. Unsure of LMP    Past Medical History  Diagnosis Date  . Cardiomyopathy   ?Dx around September of 2015, had EKG but no echocardiogram.    Past Surgical History  Procedure Laterality Date  . Cesarean section    . Wisdom tooth extraction       (Not in a hospital admission) Allergies  Allergen Reactions  . Vicodin [Hydrocodone-Acetaminophen] Other (See Comments)    History  Substance Use Topics  . Smoking status: Former Smoker    Quit date: 01/29/2015  . Smokeless tobacco: Never Used  . Alcohol Use: No    Family History  Problem Relation Age of Onset  . Diabetes Maternal Grandmother   . Hypertension Maternal Grandmother   . Hypertension Maternal Grandfather   . Hypertension Paternal Grandfather      Review of Systems Constitutional: negative for weight loss Gastrointestinal: negative for vomiting Genitourinary:negative for genital lesions and vaginal discharge and dysuria Musculoskeletal:negative for back pain Behavioral/Psych: negative for abusive relationship, depression, illegal drug usage and tobacco use    Objective:    BP 116/76 mmHg  Pulse 90  Temp(Src) 99 F (37.2 C)  Wt 157 lb (71.215 kg)  LMP 12/17/2014 General Appearance:    Alert, cooperative, no distress, appears stated age  Head:     Normocephalic, without obvious abnormality, atraumatic  Eyes:    PERRL, conjunctiva/corneas clear, EOM's intact, fundi    benign, both eyes  Ears:    Normal TM's and external ear canals, both ears  Nose:   Nares normal, septum midline, mucosa normal, no drainage    or sinus tenderness  Throat:   Lips, mucosa, and tongue normal; teeth and gums normal  Neck:   Supple, symmetrical, trachea midline, no adenopathy;    thyroid:  no enlargement/tenderness/nodules; no carotid   bruit or JVD  Back:     Symmetric, no curvature, ROM normal, no CVA tenderness  Lungs:     Clear to auscultation bilaterally, respirations unlabored  Chest Wall:    No tenderness or deformity   Heart:    Regular rate and rhythm, S1 and S2 normal, no murmur, rub   or gallop  Breast Exam:    No tenderness, masses, or nipple abnormality  Abdomen:     Soft, non-tender, bowel sounds active all four quadrants,    no masses, no organomegaly  Genitalia:    Normal female without lesion, discharge or tenderness  Extremities:   Extremities normal, atraumatic, no cyanosis or edema  Pulses:   2+ and symmetric all extremities  Skin:   Skin color, texture, turgor normal, no rashes or lesions  Lymph nodes:   Cervical, supraclavicular, and axillary nodes normal  Neurologic:   CNII-XII intact, normal strength, sensation and reflexes    throughout  Cervix:  Long, thick, closed, posterior    Lab Review Urine pregnancy test Labs reviewed yes Radiologic studies reviewed no Assessment:    Pregnancy at [redacted]w[redacted]d weeks   Unsure of LMP ?cardiomyopathy hx Nausea & vomiting Constipation   Plan:      Prenatal vitamins.  Counseling provided regarding continued use of seat belts, cessation of alcohol consumption, smoking or use of illicit drugs; infection precautions i.e., influenza/TDAP immunizations, toxoplasmosis,CMV, parvovirus, listeria and varicella; workplace safety, exercise during pregnancy; routine dental care, safe medications,  sexual activity, hot tubs, saunas, pools, travel, caffeine use, fish and methlymercury, potential toxins, hair treatments, varicose veins Weight gain recommendations per IOM guidelines reviewed: underweight/BMI< 18.5--> gain 28 - 40 lbs; normal weight/BMI 18.5 - 24.9--> gain 25 - 35 lbs; overweight/BMI 25 - 29.9--> gain 15 - 25 lbs; obese/BMI >30->gain  11 - 20 lbs Problem list reviewed and updated. FIRST/CF mutation testing/NIPT/QUAD SCREEN/fragile X/Ashkenazi Jewish population testing/Spinal muscular atrophy discussed: requested. Role of ultrasound in pregnancy discussed; fetal survey: requested. Amniocentesis discussed: not indicated.   Meds ordered this encounter  Medications  . docusate sodium (COLACE) 100 MG capsule    Sig: Take 1 capsule (100 mg total) by mouth 2 (two) times daily.    Dispense:  10 capsule    Refill:  0  . Doxylamine-Pyridoxine (DICLEGIS) 10-10 MG TBEC    Sig: Take 1 tablet with breakfast, 1 tablet with lunch and 2 tablets at dinner time.    Dispense:  100 tablet    Refill:  3  . butalbital-acetaminophen-caffeine (FIORICET) 50-325-40 MG per tablet    Sig: Take 1-2 tablets by mouth every 6 (six) hours as needed for headache.    Dispense:  30 tablet    Refill:  2   Orders Placed This Encounter  Procedures  . Culture, OB Urine  . SureSwab, Vaginosis/Vaginitis Plus  . US OB Transvaginal    Standing Status: Future     Number of Occurrences:      Standing Expiration Date: 04/30/2016    Order Specific Question:  Reason for Exam (SYMPTOM  OR DIAGNOSIS REQUIRED)    Answer:  dating, unsure of LMP    Order Specific Question:  Preferred imaging location?    Answer:  Internal  . Obstetric panel  . HIV antibody  . Hemoglobinopathy evaluation  . Varicella zoster antibody, IgG  . Vit D  25 hydroxy (rtn osteoporosis monitoring)  . Ambulatory referral to Cardiology    Referral Priority:  Routine    Referral Type:  Consultation    Referral Reason:  Specialty Services  Required    Requested Specialty:  Cardiology    Number of Visits Requested:  1  . POCT urinalysis dipstick    Follow up in 4 weeks. 50% of 30 min visit spent on counseling and coordination of care.

## 2015-03-02 ENCOUNTER — Telehealth: Payer: Self-pay | Admitting: *Deleted

## 2015-03-02 LAB — OBSTETRIC PANEL
ANTIBODY SCREEN: NEGATIVE
BASOS PCT: 0 % (ref 0–1)
Basophils Absolute: 0 10*3/uL (ref 0.0–0.1)
EOS ABS: 0.1 10*3/uL (ref 0.0–0.7)
Eosinophils Relative: 1 % (ref 0–5)
HEMATOCRIT: 35.9 % — AB (ref 36.0–46.0)
HEMOGLOBIN: 12 g/dL (ref 12.0–15.0)
Hepatitis B Surface Ag: NEGATIVE
Lymphocytes Relative: 24 % (ref 12–46)
Lymphs Abs: 2.1 10*3/uL (ref 0.7–4.0)
MCH: 30 pg (ref 26.0–34.0)
MCHC: 33.4 g/dL (ref 30.0–36.0)
MCV: 89.8 fL (ref 78.0–100.0)
MONO ABS: 0.8 10*3/uL (ref 0.1–1.0)
MONOS PCT: 9 % (ref 3–12)
MPV: 9.5 fL (ref 8.6–12.4)
Neutro Abs: 5.7 10*3/uL (ref 1.7–7.7)
Neutrophils Relative %: 66 % (ref 43–77)
Platelets: 275 10*3/uL (ref 150–400)
RBC: 4 MIL/uL (ref 3.87–5.11)
RDW: 13.4 % (ref 11.5–15.5)
RH TYPE: NEGATIVE
Rubella: 4.35 Index — ABNORMAL HIGH (ref <0.90)
WBC: 8.6 10*3/uL (ref 4.0–10.5)

## 2015-03-02 LAB — VARICELLA ZOSTER ANTIBODY, IGG: VARICELLA IGG: 1529 {index} — AB (ref <135.00)

## 2015-03-02 LAB — CULTURE, OB URINE
Colony Count: NO GROWTH
Organism ID, Bacteria: NO GROWTH

## 2015-03-02 LAB — VITAMIN D 25 HYDROXY (VIT D DEFICIENCY, FRACTURES): VIT D 25 HYDROXY: 9 ng/mL — AB (ref 30–100)

## 2015-03-02 LAB — HIV ANTIBODY (ROUTINE TESTING W REFLEX): HIV: NONREACTIVE

## 2015-03-02 NOTE — Telephone Encounter (Signed)
Pt called stating that she had missed a call from our office.  Return call to pt.  Pt states that she had voicemail from our office regarding appt that was made for cardiology for her. Verified appt time and date with pt. Pt has no other concerns.

## 2015-03-05 LAB — PAP IG AND HPV HIGH-RISK: HPV DNA HIGH RISK: NOT DETECTED

## 2015-03-05 LAB — SURESWAB, VAGINOSIS/VAGINITIS PLUS
Atopobium vaginae: 7.1 Log (cells/mL)
C. albicans, DNA: DETECTED — AB
C. glabrata, DNA: NOT DETECTED
C. parapsilosis, DNA: NOT DETECTED
C. trachomatis RNA, TMA: NOT DETECTED
C. tropicalis, DNA: NOT DETECTED
Gardnerella vaginalis: >8 Log (cells/mL)
LACTOBACILLUS SPECIES: NOT DETECTED Log (cells/mL)
N. gonorrhoeae RNA, TMA: NOT DETECTED
T. vaginalis RNA, QL TMA: NOT DETECTED

## 2015-03-05 LAB — HEMOGLOBINOPATHY EVALUATION
HGB A: 96.8 % (ref 96.8–97.8)
Hemoglobin Other: 0 %
Hgb A2 Quant: 3.2 % (ref 2.2–3.2)
Hgb F Quant: 0 % (ref 0.0–2.0)
Hgb S Quant: 0 %

## 2015-03-06 ENCOUNTER — Other Ambulatory Visit: Payer: Self-pay | Admitting: Certified Nurse Midwife

## 2015-03-06 DIAGNOSIS — B3731 Acute candidiasis of vulva and vagina: Secondary | ICD-10-CM

## 2015-03-06 DIAGNOSIS — B373 Candidiasis of vulva and vagina: Secondary | ICD-10-CM

## 2015-03-06 DIAGNOSIS — B9689 Other specified bacterial agents as the cause of diseases classified elsewhere: Secondary | ICD-10-CM

## 2015-03-06 DIAGNOSIS — N760 Acute vaginitis: Principal | ICD-10-CM

## 2015-03-06 MED ORDER — FLUCONAZOLE 100 MG PO TABS: 100.0000 mg | ORAL_TABLET | Freq: Once | ORAL | Status: DC

## 2015-03-06 MED ORDER — METRONIDAZOLE 0.75 % VA GEL: 1.0000 | Freq: Two times a day (BID) | VAGINAL | Status: DC

## 2015-03-09 ENCOUNTER — Telehealth: Payer: Self-pay | Admitting: *Deleted

## 2015-03-09 NOTE — Telephone Encounter (Signed)
Pt called to office regarding approval on Diclegis.  Return call to pt making her aware that medication has been approved and that she may pick up at pharmacy.

## 2015-03-12 ENCOUNTER — Telehealth: Payer: Self-pay | Admitting: Obstetrics

## 2015-03-13 NOTE — Telephone Encounter (Signed)
[id number] Lydia Blair stated she did not have a second insurance. brm

## 2015-03-15 ENCOUNTER — Ambulatory Visit (INDEPENDENT_AMBULATORY_CARE_PROVIDER_SITE_OTHER): Payer: Medicaid Other

## 2015-03-15 ENCOUNTER — Other Ambulatory Visit: Payer: Self-pay | Admitting: Certified Nurse Midwife

## 2015-03-15 DIAGNOSIS — Z36 Encounter for antenatal screening of mother: Secondary | ICD-10-CM | POA: Diagnosis not present

## 2015-03-15 DIAGNOSIS — Z3687 Encounter for antenatal screening for uncertain dates: Secondary | ICD-10-CM

## 2015-03-15 LAB — US OB COMP LESS 14 WKS

## 2015-03-20 ENCOUNTER — Telehealth: Payer: Self-pay | Admitting: *Deleted

## 2015-03-20 NOTE — Telephone Encounter (Signed)
Patient states she was given BV gel and yeast medication at her last appointment. She still has a thick white discharge that she thinks is yeast and would like treatment for that.

## 2015-03-21 ENCOUNTER — Other Ambulatory Visit: Payer: Self-pay | Admitting: Certified Nurse Midwife

## 2015-03-21 DIAGNOSIS — B373 Candidiasis of vulva and vagina: Secondary | ICD-10-CM

## 2015-03-21 DIAGNOSIS — B3731 Acute candidiasis of vulva and vagina: Secondary | ICD-10-CM

## 2015-03-21 MED ORDER — TERCONAZOLE 0.4 % VA CREA: 1.0000 | TOPICAL_CREAM | Freq: Every day | VAGINAL | Status: DC

## 2015-03-21 MED ORDER — FLUCONAZOLE 100 MG PO TABS: 100.0000 mg | ORAL_TABLET | Freq: Once | ORAL | Status: DC

## 2015-03-21 NOTE — Telephone Encounter (Signed)
Please let her know that I sent both a Vaginal yeast cream and diflucan sent to the pharmacy for her.  This may just be leukorrhea of pregnancy.  Thanks.

## 2015-03-22 NOTE — Telephone Encounter (Signed)
Patient notified

## 2015-03-23 ENCOUNTER — Encounter (HOSPITAL_COMMUNITY): Payer: Self-pay | Admitting: *Deleted

## 2015-03-23 ENCOUNTER — Inpatient Hospital Stay (HOSPITAL_COMMUNITY)
Admission: AD | Admit: 2015-03-23 | Discharge: 2015-03-23 | Disposition: A | Discharge status: Home / Self Care | Payer: Medicaid Other | Source: Ambulatory Visit | Attending: Obstetrics | Admitting: Obstetrics

## 2015-03-23 DIAGNOSIS — Z6791 Unspecified blood type, Rh negative: Secondary | ICD-10-CM | POA: Insufficient documentation

## 2015-03-23 DIAGNOSIS — O200 Threatened abortion: Secondary | ICD-10-CM

## 2015-03-23 DIAGNOSIS — Z87891 Personal history of nicotine dependence: Secondary | ICD-10-CM | POA: Diagnosis not present

## 2015-03-23 DIAGNOSIS — Z3A13 13 weeks gestation of pregnancy: Secondary | ICD-10-CM | POA: Diagnosis not present

## 2015-03-23 DIAGNOSIS — G43909 Migraine, unspecified, not intractable, without status migrainosus: Secondary | ICD-10-CM | POA: Diagnosis not present

## 2015-03-23 DIAGNOSIS — K5900 Constipation, unspecified: Secondary | ICD-10-CM | POA: Diagnosis not present

## 2015-03-23 DIAGNOSIS — O360910 Maternal care for other rhesus isoimmunization, first trimester, not applicable or unspecified: Secondary | ICD-10-CM

## 2015-03-23 DIAGNOSIS — R51 Headache: Secondary | ICD-10-CM

## 2015-03-23 DIAGNOSIS — O26891 Other specified pregnancy related conditions, first trimester: Secondary | ICD-10-CM | POA: Diagnosis not present

## 2015-03-23 DIAGNOSIS — R519 Headache, unspecified: Secondary | ICD-10-CM

## 2015-03-23 DIAGNOSIS — O209 Hemorrhage in early pregnancy, unspecified: Secondary | ICD-10-CM

## 2015-03-23 DIAGNOSIS — K5909 Other constipation: Secondary | ICD-10-CM

## 2015-03-23 HISTORY — DX: Benign neoplasm of unspecified site of left orbit: D31.62

## 2015-03-23 LAB — URINALYSIS, ROUTINE W REFLEX MICROSCOPIC
BILIRUBIN URINE: NEGATIVE
Glucose, UA: NEGATIVE mg/dL
Ketones, ur: NEGATIVE mg/dL
Leukocytes, UA: NEGATIVE
NITRITE: NEGATIVE
PROTEIN: NEGATIVE mg/dL
SPECIFIC GRAVITY, URINE: 1.025 (ref 1.005–1.030)
Urobilinogen, UA: 0.2 mg/dL (ref 0.0–1.0)
pH: 6.5 (ref 5.0–8.0)

## 2015-03-23 LAB — URINE MICROSCOPIC-ADD ON

## 2015-03-23 MED ORDER — BUTALBITAL-APAP-CAFFEINE 50-325-40 MG PO TABS
1.0000 | ORAL_TABLET | Freq: Once | ORAL | Status: AC
  Administered 2015-03-23: 1 via ORAL
  Filled 2015-03-23: qty 1

## 2015-03-23 MED ORDER — RHO D IMMUNE GLOBULIN 1500 UNIT/2ML IJ SOSY
300.0000 ug | PREFILLED_SYRINGE | Freq: Once | INTRAMUSCULAR | Status: AC
  Administered 2015-03-23: 300 ug via INTRAMUSCULAR
  Filled 2015-03-23: qty 2

## 2015-03-23 NOTE — MAU Note (Signed)
Yesterday she and her boyfriend got into an argument. Was upset for several hours.  They had sex last night, later she noted some pinkish d/c.  Some discomfort in lower abd

## 2015-03-23 NOTE — MAU Provider Note (Signed)
History     CSN: [id number]  Arrival date and time: 03/23/15 2951   First Provider Initiated Contact with Patient 03/23/15 [phone]      Chief Complaint  Patient presents with  . Vaginal Bleeding  . Abdominal Pain   HPI Lydia Blair 29 y.o. [id number] @[redacted]w[redacted]d  presents to MAU complaining of bleeding and constipation.  Constipation has been ongoing for weeks.  Ms. Margurite Auerbach advised her to use Colace a week ago.  She used it 2 days ago and stopped it because her aunt told her it was dangerous.  She has continued to be constipated.  She last had BM 2 days ago and it was small pebbles of poo.  Bleeding started last night and is pinkish red.  She has used 2 pads and notes a small amt on pad and then some on toilet paper for wiping.  It is lighter today in MAU.  Last sex was last night.  She denies abdominal pain, change in vaginal discharge, back pain, fever, weakness, dizziness, headache.  She has not yet noted fetal movement this pregnancy.   OB History    Gravida Para Term Preterm AB TAB SAB Ectopic Multiple Living   6 3 3  2 2    3       Past Medical History  Diagnosis Date  . Cardiomyopathy   . Dermoid cyst of left orbit     Past Surgical History  Procedure Laterality Date  . Cesarean section    . Wisdom tooth extraction      Family History  Problem Relation Age of Onset  . Diabetes Maternal Grandmother   . Hypertension Maternal Grandmother   . Hypertension Maternal Grandfather   . Hypertension Paternal Grandfather     History  Substance Use Topics  . Smoking status: Former Smoker    Quit date: 01/29/2015  . Smokeless tobacco: Never Used  . Alcohol Use: No    Allergies:  Allergies  Allergen Reactions  . Vicodin [Hydrocodone-Acetaminophen] Other (See Comments)    Prescriptions prior to admission  Medication Sig Dispense Refill Last Dose  . butalbital-acetaminophen-caffeine (FIORICET) 50-325-40 MG per tablet Take 1-2 tablets by mouth every 6 (six) hours as needed for  headache. 30 tablet 2   . docusate sodium (COLACE) 100 MG capsule Take 1 capsule (100 mg total) by mouth 2 (two) times daily. 10 capsule 0   . Doxylamine-Pyridoxine (DICLEGIS) 10-10 MG TBEC Take 1 tablet with breakfast, 1 tablet with lunch and 2 tablets at dinner time. 100 tablet 3   . fluconazole (DIFLUCAN) 100 MG tablet Take 1 tablet (100 mg total) by mouth once. Repeat dose in 48-72 hour. 3 tablet 0   . metroNIDAZOLE (METROGEL VAGINAL) 0.75 % vaginal gel Place 1 Applicatorful vaginally 2 (two) times daily. 70 g 0   . terconazole (TERAZOL 7) 0.4 % vaginal cream Place 1 applicator vaginally at bedtime. 45 g 0     ROS Pertinent ROS in HPI.  All other systems are negative.   Physical Exam   Blood pressure 115/73, pulse 68, temperature 98.4 F (36.9 C), temperature source Oral, resp. rate 18, height 5' 0.5" (1.537 m), weight 156 lb (70.761 kg), last menstrual period 12/17/2014.  Physical Exam  Constitutional: She is oriented to person, place, and time. She appears well-developed and well-nourished. No distress.  HENT:  Head: Atraumatic.  Eyes: EOM are normal.  Neck: Normal range of motion.  Cardiovascular: Normal rate, regular rhythm and normal heart sounds.   Respiratory: Effort normal  and breath sounds normal. No respiratory distress.  GI: Soft. Bowel sounds are normal. She exhibits no distension. There is no tenderness.  Genitourinary:  Scant brown blood noted in vagina but no active bleeding from cervix.   Cervix is closed.  No CMT. No tenderness at fundus.    Musculoskeletal: Normal range of motion.  Neurological: She is alert and oriented to person, place, and time.  Skin: Skin is warm and dry.  Psychiatric: She has a normal mood and affect.   Fetal heart tones detected at 141 with Doppler on exam.    MAU Course  Procedures  MDM Heart tones are reassuring for fetus.  No active bleeding presently but evidence there was bleeding.   Pt is B- and therefore requires rhogam  workup and injection while here in MAU While awaiting rhogam, pt noted HA - left sided only - throbbing - with white spots in visual field- known migraines- requesting HA medication.   Fioricet ordered Pt indicates HA improvement   Assessment and Plan  A: vaginal bleeding in pregnancy Threatened ab Rh negative complicating pregnancy Constipation in preg Migraine HA  P: discharge to home Colace is fine to use.  Increase fluids/fiber in diet OTC Tylenol for further HA Pelvic rest  Keep appts for [facility] Patient may return to MAU as needed or if her condition were to change or worsen   Paticia Stack 03/23/2015, 8:23 AM

## 2015-03-23 NOTE — Discharge Instructions (Signed)
Pelvic Rest Pelvic rest is sometimes recommended for women when:   The placenta is partially or completely covering the opening of the cervix (placenta previa).  There is bleeding between the uterine wall and the amniotic sac in the first trimester (subchorionic hemorrhage).  The cervix begins to open without labor starting (incompetent cervix, cervical insufficiency).  The labor is too early (preterm labor). HOME CARE INSTRUCTIONS  Do not have sexual intercourse, stimulation, or an orgasm.  Do not use tampons, douche, or put anything in the vagina.  Do not lift anything over 10 pounds (4.5 kg).  Avoid strenuous activity or straining your pelvic muscles. SEEK MEDICAL CARE IF:  You have any vaginal bleeding during pregnancy. Treat this as a potential emergency.  You have cramping pain felt low in the stomach (stronger than menstrual cramps).  You notice vaginal discharge (watery, mucus, or bloody).  You have a low, dull backache.  There are regular contractions or uterine tightening. SEEK IMMEDIATE MEDICAL CARE IF: You have vaginal bleeding and have placenta previa.  Document Released: 01/03/2011 Document Revised: 12/01/2011 Document Reviewed: 01/03/2011 [facility] Patient Information 2015 [place], [place]. This information is not intended to replace advice given to you by your health care provider. Make sure you discuss any questions you have with your health care provider.

## 2015-03-23 NOTE — MAU Note (Signed)
Has been constipated, was given rx for stool softener, but her mom didn't think she should be taking them.  Discussed the purpose of stool softener and problems associated with ongoing constipation and straining

## 2015-03-23 NOTE — MAU Note (Signed)
Got upset yesterday; had sex last night and is having some pinkish discharge; c/o constipation since the pregnancy began and having loss of appetite and cramping;

## 2015-03-24 LAB — RH IG WORKUP (INCLUDES ABO/RH)
ABO/RH(D): B NEG
ANTIBODY SCREEN: NEGATIVE
Gestational Age(Wks): 13
UNIT DIVISION: 0

## 2015-03-28 ENCOUNTER — Other Ambulatory Visit: Payer: Self-pay | Admitting: Certified Nurse Midwife

## 2015-03-29 ENCOUNTER — Ambulatory Visit (INDEPENDENT_AMBULATORY_CARE_PROVIDER_SITE_OTHER): Payer: Medicaid Other | Admitting: Certified Nurse Midwife

## 2015-03-29 VITALS — BP 116/78 | HR 67 | Temp 97.7°F

## 2015-03-29 DIAGNOSIS — Z3482 Encounter for supervision of other normal pregnancy, second trimester: Secondary | ICD-10-CM | POA: Diagnosis not present

## 2015-03-29 LAB — POCT URINALYSIS DIPSTICK
Bilirubin, UA: NEGATIVE
Glucose, UA: NEGATIVE
KETONES UA: NEGATIVE
Leukocytes, UA: NEGATIVE
Nitrite, UA: NEGATIVE
Protein, UA: NEGATIVE
SPEC GRAV UA: 1.01
Urobilinogen, UA: NEGATIVE
pH, UA: 7

## 2015-03-29 MED ORDER — VITAFOL ULTRA 29-0.6-0.4-200 MG PO CAPS: 1.0000 | ORAL_CAPSULE | Freq: Every day | ORAL | Status: DC

## 2015-03-29 NOTE — Progress Notes (Signed)
  Subjective:    Lydia Blair is a 29 y.o. female being seen today for her obstetrical visit. She is at [redacted]w[redacted]d gestation. Patient reports: no bleeding, no contractions, no cramping, no leaking and had been seen in MAU July 1st for vaginal bleeding for several days.  Denies any recent sexual intercourse.  Offered [facility], patient was teary when entering the room.  She has a cardiology consult for August 10th scheduled.  Ultrasound ordered today, was not done at [facility] on the 1st. .  Problem List Items Addressed This Visit    None    Visit Diagnoses    Encounter for supervision of other normal pregnancy in second trimester    -  Primary    Relevant Medications    Prenat-Fe Poly-Methfol-FA-DHA (VITAFOL ULTRA) 29-0.6-0.4-200 MG CAPS    Other Relevant Orders    POCT urinalysis dipstick (Completed)    US OB Comp + 14 Wk      Patient Active Problem List   Diagnosis Date Noted  . Supervision of normal pregnancy 03/01/2015  . BV (bacterial vaginosis) 02/08/2014  . Vaginitis and vulvovaginitis, unspecified 06/02/2013    Objective:     BP 116/78 mmHg  Pulse 67  Temp(Src) 97.7 F (36.5 C)  LMP 12/17/2014 Uterine Size: Below umbilicus     Assessment:    Pregnancy @ [redacted]w[redacted]d  weeks Doing well    Plan:    Problem list reviewed and updated. Labs reviewed.  Follow up in 3 weeks. FIRST/CF mutation testing/NIPT/QUAD SCREEN/fragile X/Ashkenazi Jewish population testing/Spinal muscular atrophy discussed: requested. Role of ultrasound in pregnancy discussed; fetal survey: requested. Amniocentesis discussed: not indicated. 50% of 30 minute visit spent on counseling and coordination of care.

## 2015-04-11 ENCOUNTER — Ambulatory Visit (HOSPITAL_COMMUNITY)
Admission: RE | Admit: 2015-04-11 | Discharge: 2015-04-11 | Disposition: A | Discharge status: Home / Self Care | Payer: Medicaid Other | Source: Ambulatory Visit | Attending: Certified Nurse Midwife | Admitting: Certified Nurse Midwife

## 2015-04-11 DIAGNOSIS — Z3689 Encounter for other specified antenatal screening: Secondary | ICD-10-CM | POA: Insufficient documentation

## 2015-04-11 DIAGNOSIS — O3482 Maternal care for other abnormalities of pelvic organs, second trimester: Secondary | ICD-10-CM | POA: Insufficient documentation

## 2015-04-11 DIAGNOSIS — Z3A00 Weeks of gestation of pregnancy not specified: Secondary | ICD-10-CM | POA: Diagnosis not present

## 2015-04-11 DIAGNOSIS — D279 Benign neoplasm of unspecified ovary: Secondary | ICD-10-CM | POA: Insufficient documentation

## 2015-04-11 DIAGNOSIS — Z3A16 16 weeks gestation of pregnancy: Secondary | ICD-10-CM | POA: Insufficient documentation

## 2015-04-11 DIAGNOSIS — Z3482 Encounter for supervision of other normal pregnancy, second trimester: Secondary | ICD-10-CM | POA: Diagnosis present

## 2015-04-12 ENCOUNTER — Telehealth: Payer: Self-pay | Admitting: *Deleted

## 2015-04-12 ENCOUNTER — Other Ambulatory Visit: Payer: Self-pay | Admitting: Certified Nurse Midwife

## 2015-04-12 NOTE — Telephone Encounter (Signed)
Patient states she was given cream for BV and yeast. Patient wasn't to know if she should use them together. Patient advised to use them one after the other. Patient verbalized understanding.

## 2015-04-13 ENCOUNTER — Encounter (HOSPITAL_COMMUNITY): Payer: Self-pay | Admitting: *Deleted

## 2015-04-13 ENCOUNTER — Inpatient Hospital Stay (HOSPITAL_COMMUNITY)
Admission: AD | Admit: 2015-04-13 | Discharge: 2015-04-13 | Disposition: A | Discharge status: Home / Self Care | Payer: Medicaid Other | Source: Ambulatory Visit | Attending: Obstetrics | Admitting: Obstetrics

## 2015-04-13 DIAGNOSIS — O219 Vomiting of pregnancy, unspecified: Secondary | ICD-10-CM | POA: Diagnosis not present

## 2015-04-13 DIAGNOSIS — R55 Syncope and collapse: Secondary | ICD-10-CM | POA: Diagnosis not present

## 2015-04-13 DIAGNOSIS — Z87891 Personal history of nicotine dependence: Secondary | ICD-10-CM | POA: Diagnosis not present

## 2015-04-13 DIAGNOSIS — Z3A16 16 weeks gestation of pregnancy: Secondary | ICD-10-CM | POA: Insufficient documentation

## 2015-04-13 DIAGNOSIS — G43909 Migraine, unspecified, not intractable, without status migrainosus: Secondary | ICD-10-CM | POA: Diagnosis not present

## 2015-04-13 DIAGNOSIS — O210 Mild hyperemesis gravidarum: Secondary | ICD-10-CM | POA: Diagnosis present

## 2015-04-13 DIAGNOSIS — G43101 Migraine with aura, not intractable, with status migrainosus: Secondary | ICD-10-CM

## 2015-04-13 HISTORY — DX: Elevated blood-pressure reading, without diagnosis of hypertension: R03.0

## 2015-04-13 LAB — COMPREHENSIVE METABOLIC PANEL
ALBUMIN: 3.7 g/dL (ref 3.5–5.0)
ALK PHOS: 46 U/L (ref 38–126)
ALT: 12 U/L — AB (ref 14–54)
AST: 16 U/L (ref 15–41)
Anion gap: 5 (ref 5–15)
BILIRUBIN TOTAL: 0.4 mg/dL (ref 0.3–1.2)
BUN: 8 mg/dL (ref 6–20)
CHLORIDE: 106 mmol/L (ref 101–111)
CO2: 24 mmol/L (ref 22–32)
Calcium: 8.9 mg/dL (ref 8.9–10.3)
Creatinine, Ser: 0.6 mg/dL (ref 0.44–1.00)
GFR calc Af Amer: >60 mL/min (ref >60)
GFR calc non Af Amer: >60 mL/min (ref >60)
Glucose, Bld: 91 mg/dL (ref 65–99)
Potassium: 3.8 mmol/L (ref 3.5–5.1)
Sodium: 135 mmol/L (ref 135–145)
Total Protein: 6.5 g/dL (ref 6.5–8.1)

## 2015-04-13 LAB — URINALYSIS, ROUTINE W REFLEX MICROSCOPIC
Bilirubin Urine: NEGATIVE
Glucose, UA: NEGATIVE mg/dL
Ketones, ur: NEGATIVE mg/dL
Leukocytes, UA: NEGATIVE
NITRITE: NEGATIVE
PROTEIN: NEGATIVE mg/dL
Specific Gravity, Urine: 1.02 (ref 1.005–1.030)
Urobilinogen, UA: 0.2 mg/dL (ref 0.0–1.0)
pH: 7 (ref 5.0–8.0)

## 2015-04-13 LAB — CBC
HEMATOCRIT: 32.6 % — AB (ref 36.0–46.0)
Hemoglobin: 11.4 g/dL — ABNORMAL LOW (ref 12.0–15.0)
MCH: 31.2 pg (ref 26.0–34.0)
MCHC: 35 g/dL (ref 30.0–36.0)
MCV: 89.3 fL (ref 78.0–100.0)
Platelets: 236 10*3/uL (ref 150–400)
RBC: 3.65 MIL/uL — ABNORMAL LOW (ref 3.87–5.11)
RDW: 13.3 % (ref 11.5–15.5)
WBC: 9.7 10*3/uL (ref 4.0–10.5)

## 2015-04-13 LAB — URINE MICROSCOPIC-ADD ON

## 2015-04-13 MED ORDER — DEXTROSE 5 % IN LACTATED RINGERS IV BOLUS
1000.0000 mL | Freq: Once | INTRAVENOUS | Status: AC
  Administered 2015-04-13: 1000 mL via INTRAVENOUS

## 2015-04-13 MED ORDER — MECLIZINE HCL 25 MG PO TABS
25.0000 mg | ORAL_TABLET | Freq: Once | ORAL | Status: AC
  Administered 2015-04-13: 25 mg via ORAL
  Filled 2015-04-13: qty 1

## 2015-04-13 MED ORDER — BUTALBITAL-APAP-CAFFEINE 50-325-40 MG PO TABS: 2.0000 | ORAL_TABLET | Freq: Once | ORAL | Status: DC

## 2015-04-13 MED ORDER — METOCLOPRAMIDE HCL 5 MG/ML IJ SOLN
10.0000 mg | Freq: Once | INTRAMUSCULAR | Status: AC
  Administered 2015-04-13: 10 mg via INTRAVENOUS
  Filled 2015-04-13: qty 2

## 2015-04-13 MED ORDER — DIPHENHYDRAMINE HCL 50 MG/ML IJ SOLN
25.0000 mg | Freq: Once | INTRAMUSCULAR | Status: AC
  Administered 2015-04-13: 25 mg via INTRAVENOUS
  Filled 2015-04-13: qty 1

## 2015-04-13 MED ORDER — MECLIZINE HCL 25 MG PO TABS: 25.0000 mg | ORAL_TABLET | Freq: Three times a day (TID) | ORAL | Status: DC | PRN

## 2015-04-13 MED ORDER — DEXAMETHASONE SODIUM PHOSPHATE 10 MG/ML IJ SOLN
10.0000 mg | Freq: Once | INTRAMUSCULAR | Status: AC
  Administered 2015-04-13: 10 mg via INTRAVENOUS
  Filled 2015-04-13: qty 1

## 2015-04-13 NOTE — MAU Note (Signed)
Pt denies HA, denies tunnel vision, denies nausea, says she feels good except for being drowsy, drinking fluids.

## 2015-04-13 NOTE — MAU Note (Signed)
Patient presents at [redacted] weeks gestation with c/o dizziness since last night followed by vomiting this morning and then tunnel vision after vomiting. Fetus active. Denies bleeding or discharge but currently taking a gel medicine for a yeast infection.

## 2015-04-13 NOTE — MAU Provider Note (Signed)
History     CSN: [id number]  Arrival date and time: 04/13/15 1345   None     Chief Complaint  Patient presents with  . Dizziness  . Emesis  . Eye Problem   HPI  Pt is 29 yo [redacted]w[redacted]d pregnant [id number] Pt complains of nausea and vomiting and "tunnel vision"-  Pt has hx of migraine h/a and has presciption for Fioricet Pt denies spotting/bleeding/crampin, constipation, diarrhea or UTI sx RN note:  04/13/2015 2:10 PM    Expand All Collapse All   Patient presents at [redacted] weeks gestation with c/o dizziness since last night followed by vomiting this morning and then tunnel vision after vomiting. Fetus active. Denies bleeding or discharge but currently taking a gel medicine for a yeast infection.       Past Medical History  Diagnosis Date  . Cardiomyopathy   . Dermoid cyst of left orbit     Past Surgical History  Procedure Laterality Date  . Cesarean section    . Wisdom tooth extraction      Family History  Problem Relation Age of Onset  . Diabetes Maternal Grandmother   . Hypertension Maternal Grandmother   . Hypertension Maternal Grandfather   . Hypertension Paternal Grandfather     History  Substance Use Topics  . Smoking status: Former Smoker    Quit date: 01/29/2015  . Smokeless tobacco: Never Used  . Alcohol Use: No    Allergies:  Allergies  Allergen Reactions  . Vicodin [Hydrocodone-Acetaminophen] Itching and Nausea And Vomiting    Prescriptions prior to admission  Medication Sig Dispense Refill Last Dose  . docusate sodium (COLACE) 100 MG capsule Take 1 capsule (100 mg total) by mouth 2 (two) times daily. (Patient not taking: Reported on 03/29/2015) 10 capsule 0 Not Taking  . fluconazole (DIFLUCAN) 100 MG tablet Take 1 tablet (100 mg total) by mouth once. Repeat dose in 48-72 hour. 3 tablet 0 Taking  . Prenat-Fe Poly-Methfol-FA-DHA (VITAFOL ULTRA) 29-0.6-0.4-200 MG CAPS Take 1 tablet by mouth daily. 30 capsule 12   . Prenatal Vit-Fe Fumarate-FA (PRENATAL  MULTIVITAMIN) TABS tablet Take 1 tablet by mouth daily at 12 noon.   Taking  . terconazole (TERAZOL 7) 0.4 % vaginal cream Place 1 applicator vaginally at bedtime. 45 g 0 Taking    Review of Systems  Constitutional: Negative for fever and chills.  Eyes: Positive for blurred vision.  Gastrointestinal: Positive for nausea and vomiting. Negative for abdominal pain, diarrhea and constipation.  Genitourinary: Negative for dysuria.  Neurological: Positive for dizziness and headaches.   Physical Exam   Blood pressure 119/69, pulse 83, temperature 98.5 F (36.9 C), temperature source Oral, resp. rate 16, height 5' 2.75" (1.594 m), weight 163 lb (73.936 kg), last menstrual period 12/17/2014.  Physical Exam  Vitals reviewed. Constitutional: She is oriented to person, place, and time. She appears well-developed and well-nourished. No distress.  HENT:  Head: Normocephalic.  Eyes: Pupils are equal, round, and reactive to light.  Neck: Normal range of motion. Neck supple.  Cardiovascular: Normal rate.   Respiratory: Effort normal.  GI: Soft.  Musculoskeletal: Normal range of motion.  Neurological: She is alert and oriented to person, place, and time.  Skin: Skin is warm and dry.  Psychiatric: She has a normal mood and affect.    MAU Course  Procedures FHR 149 bpm with doppler IVF with benadryl, reglan and decadron EKG normal sinus rhythm Results for orders placed or performed during the hospital encounter of 04/13/15 (from the  past 24 hour(s))  Urinalysis, Routine w reflex microscopic (not at [facility])     Status: Abnormal   Collection Time: 04/13/15  2:15 PM  Result Value Ref Range   Color, Urine YELLOW YELLOW   APPearance CLEAR CLEAR   Specific Gravity, Urine 1.020 1.005 - 1.030   pH 7.0 5.0 - 8.0   Glucose, UA NEGATIVE NEGATIVE mg/dL   Hgb urine dipstick SMALL (A) NEGATIVE   Bilirubin Urine NEGATIVE NEGATIVE   Ketones, ur NEGATIVE NEGATIVE mg/dL   Protein, ur NEGATIVE NEGATIVE mg/dL    Urobilinogen, UA 0.2 0.0 - 1.0 mg/dL   Nitrite NEGATIVE NEGATIVE   Leukocytes, UA NEGATIVE NEGATIVE  Urine microscopic-add on     Status: Abnormal   Collection Time: 04/13/15  2:15 PM  Result Value Ref Range   Squamous Epithelial / LPF RARE RARE   RBC / HPF 0-2 <3 RBC/hpf   Bacteria, UA FEW (A) RARE   Urine-Other MUCOUS PRESENT   CBC     Status: Abnormal   Collection Time: 04/13/15  3:09 PM  Result Value Ref Range   WBC 9.7 4.0 - 10.5 K/uL   RBC 3.65 (L) 3.87 - 5.11 MIL/uL   Hemoglobin 11.4 (L) 12.0 - 15.0 g/dL   HCT 32.6 (L) 36.0 - 46.0 %   MCV 89.3 78.0 - 100.0 fL   MCH 31.2 26.0 - 34.0 pg   MCHC 35.0 30.0 - 36.0 g/dL   RDW 13.3 11.5 - 15.5 %   Platelets 236 150 - 400 K/uL  Comprehensive metabolic panel     Status: Abnormal   Collection Time: 04/13/15  3:09 PM  Result Value Ref Range   Sodium 135 135 - 145 mmol/L   Potassium 3.8 3.5 - 5.1 mmol/L   Chloride 106 101 - 111 mmol/L   CO2 24 22 - 32 mmol/L   Glucose, Bld 91 65 - 99 mg/dL   BUN 8 6 - 20 mg/dL   Creatinine, Ser 0.60 0.44 - 1.00 mg/dL   Calcium 8.9 8.9 - 10.3 mg/dL   Total Protein 6.5 6.5 - 8.1 g/dL   Albumin 3.7 3.5 - 5.0 g/dL   AST 16 15 - 41 U/L   ALT 12 (L) 14 - 54 U/L   Alkaline Phosphatase 46 38 - 126 U/L   Total Bilirubin 0.4 0.3 - 1.2 mg/dL   GFR calc non Af Amer >60 >60 mL/min   GFR calc Af Amer >60 >60 mL/min   Anion gap 5 5 - 15  pt got relief of headache/aura with Benadryl, Decadron and Reglan Nausea relieved with meclezine Assessment and Plan  Near syncope in pregnancy- increase fluids Nausea and vomiting in pregnancy-AntivertRX F/u with Dr. Jodi Mourning Migraine headache- take Fioricet (pt has prescription)   Sherolyn Buba 04/13/2015, 2:36 PM

## 2015-04-18 ENCOUNTER — Other Ambulatory Visit: Payer: Self-pay | Admitting: Certified Nurse Midwife

## 2015-04-18 ENCOUNTER — Ambulatory Visit (INDEPENDENT_AMBULATORY_CARE_PROVIDER_SITE_OTHER): Payer: Medicaid Other | Admitting: Certified Nurse Midwife

## 2015-04-18 VITALS — BP 121/73 | HR 80 | Wt 163.0 lb

## 2015-04-18 DIAGNOSIS — Z3482 Encounter for supervision of other normal pregnancy, second trimester: Secondary | ICD-10-CM | POA: Diagnosis not present

## 2015-04-18 LAB — POCT URINALYSIS DIPSTICK
Bilirubin, UA: NEGATIVE
Glucose, UA: NEGATIVE
Ketones, UA: NEGATIVE
LEUKOCYTES UA: NEGATIVE
Nitrite, UA: NEGATIVE
Protein, UA: NEGATIVE
Spec Grav, UA: 1.015
Urobilinogen, UA: NEGATIVE
pH, UA: 7

## 2015-04-18 NOTE — Progress Notes (Signed)
Reviewed with patient pelvic rest.  Told her no sexual intercourse since she has the previa.  Patient verbalized understanding.

## 2015-04-18 NOTE — Progress Notes (Signed)
  Subjective:    Lydia Blair is a 29 y.o. female being seen today for her obstetrical visit. She is at [redacted]w[redacted]d gestation. Patient reports: no bleeding, no contractions, no cramping, no leaking and right breast pain.  + fetal movement.  Has not had a bra fitting in 2 years.  Size J cup.  Encouraged bra fitting at [facility].  Has not had any more syncope episodes or HA since last Friday.  Cardiology consult already scheduled for ? dx of cardiomyopathy.    Problem List Items Addressed This Visit    None    Visit Diagnoses    Encounter for supervision of other normal pregnancy in second trimester    -  Primary    Relevant Orders    POCT urinalysis dipstick (Completed)    US OB Limited    AFP, Quad Screen      Patient Active Problem List   Diagnosis Date Noted  . [redacted] weeks gestation of pregnancy   . Encounter for fetal anatomic survey   . Supervision of normal pregnancy 03/01/2015  . BV (bacterial vaginosis) 02/08/2014  . Vaginitis and vulvovaginitis, unspecified 06/02/2013    Objective:     BP 121/73 mmHg  Pulse 80  Wt 163 lb (73.936 kg)  LMP 12/17/2014 Uterine Size: Below umbilicus   FHR: 093 by doppler.  Negative right breast exam.  Assessment:    Pregnancy @ [redacted]w[redacted]d  weeks Doing well    Breast pain d/t pendulous breast tissue  Plan:    Problem list reviewed and updated. Labs reviewed. Reviewed MAU visit from last Friday.   Follow up in 4 weeks. FIRST/CF mutation testing/NIPT/QUAD SCREEN/fragile X/Ashkenazi Jewish population testing/Spinal muscular atrophy discussed: ordered. Role of ultrasound in pregnancy discussed; fetal survey: ordered for f/u in 5 weeks.  Amniocentesis discussed: not indicated. 50% of 15 minute visit spent on counseling and coordination of care.

## 2015-04-20 LAB — AFP, QUAD SCREEN
AFP: 33.5 ng/mL
Curr Gest Age: 17.3 wks.days
Down Syndrome Scr Risk Est: 1:2850 {titer}
HCG, Total: 11.78 IU/mL
INH: 252.4 pg/mL
Interpretation-AFP: NEGATIVE
MOM FOR AFP: 0.75
MOM FOR HCG: 0.37
MoM for INH: 1.52
Open Spina bifida: NEGATIVE
Osb Risk: <1:54600 {titer}
TRI 18 SCR RISK EST: NEGATIVE
Trisomy 18 (Edward) Syndrome Interp.: 1:1330 {titer}
uE3 Mom: 0.76
uE3 Value: 0.87 ng/mL

## 2015-05-02 ENCOUNTER — Encounter: Payer: Self-pay | Admitting: Cardiology

## 2015-05-02 ENCOUNTER — Ambulatory Visit (INDEPENDENT_AMBULATORY_CARE_PROVIDER_SITE_OTHER): Payer: Medicaid Other | Admitting: Cardiology

## 2015-05-02 VITALS — BP 132/82 | HR 83 | Ht 62.5 in | Wt 167.7 lb

## 2015-05-02 DIAGNOSIS — R002 Palpitations: Secondary | ICD-10-CM

## 2015-05-02 DIAGNOSIS — R0602 Shortness of breath: Secondary | ICD-10-CM

## 2015-05-02 DIAGNOSIS — R42 Dizziness and giddiness: Secondary | ICD-10-CM | POA: Diagnosis not present

## 2015-05-02 DIAGNOSIS — I429 Cardiomyopathy, unspecified: Secondary | ICD-10-CM | POA: Diagnosis not present

## 2015-05-02 NOTE — Progress Notes (Signed)
Cardiology Office Note   Date:  05/02/2015   ID:  Lydia Blair, Lydia Blair [birth year], MRN [id number]  PCP:  No PCP Per Patient    Chief Complaint  Patient presents with  . Follow-up    cardiomyopathy      History of Present Illness: Lydia Blair is a 29 y.o. female who has a history of cardiomyopathy and postpartum HTN with prior pregnancy in 2005 and is now [redacted] weeks pregnant.  A few weeks ago she had some nausea and vomiting and then developed tunnel vision.  It was recommended that she increase her fluids at that time.  She is referred now to Cardiology for evaluation of history of Cardiomyopathy.  She denies any chest pain, SOB, DOE, LE edema or palpitations.  She has not had any further dizzy spells.  2D echo from 2005 showed normal LVF with mild to moderate MR and small pericardial effusion.  Apparently she had an EKG 1-2 years ago by her PCP and was told her heart was enlarged and was supposed to referred to Cardiology at that time but never was. Apparently she was having some fluttering of her heart at that time.  She says that now she notices fluttering intermittently in her chest but not everyday.  Usually it lasts about 4-5 minutes at a time.  There is no dizziness associated with the palpitations.  She also had intermittent CP that is sharp and located under her left breast.  It will last 4-5 minutes and resolve.  There is no change with deep breathing or movement.  The pain occurs a 2-3 times a month and has been going on for about a year.  She says that her sister has some heart problems but does not know what the problem is.  She has chronic SOB even at rest which has been occurring for over 2 years.  She denies any LE edema.      Past Medical History  Diagnosis Date  . Cardiomyopathy 2014  . Dermoid cyst of left orbit   . Prehypertension     Past Surgical History  Procedure Laterality Date  . Cesarean section    . Wisdom tooth extraction    .  Incise and drain abcess       Current Outpatient Prescriptions  Medication Sig Dispense Refill  . butalbital-acetaminophen-caffeine (FIORICET, ESGIC) 50-325-40 MG per tablet Take 1 tablet by mouth 2 (two) times daily as needed for headache.    . docusate sodium (COLACE) 100 MG capsule Take 1 capsule (100 mg total) by mouth 2 (two) times daily. (Patient taking differently: Take 100 mg by mouth 2 (two) times daily as needed for mild constipation. ) 10 capsule 0  . meclizine (ANTIVERT) 25 MG tablet Take 1 tablet (25 mg total) by mouth 3 (three) times daily as needed for dizziness. 30 tablet 0  . Prenat-Fe Poly-Methfol-FA-DHA (VITAFOL ULTRA) 29-0.6-0.4-200 MG CAPS Take 1 tablet by mouth daily. 30 capsule 12  . terconazole (TERAZOL 7) 0.4 % vaginal cream Place 1 applicator vaginally at bedtime. 45 g 0   No current facility-administered medications for this visit.    Allergies:   Vicodin    Social History:  The patient  reports that she quit smoking about 3 months ago. She has never used smokeless tobacco. She reports that she does not drink alcohol or use illicit drugs.   Family History:  The patient's  family history includes Diabetes in her maternal grandmother; HIV in her father; Hypertension in her maternal grandfather, maternal grandmother, paternal grandfather, and sister.    ROS:  Please see the history of present illness.   Otherwise, review of systems are positive for none.   All other systems are reviewed and negative.    PHYSICAL EXAM: VS:  BP 132/82 mmHg  Pulse 83  Ht 5' 2.5" (1.588 m)  Wt 167 lb 11.2 oz (76.068 kg)  BMI 30.16 kg/m2  SpO2 99%  LMP 12/17/2014 , BMI Body mass index is 30.16 kg/(m^2). GEN: Well nourished, well developed, in no acute distress HEENT: normal Neck: no JVD, carotid bruits, or masses Cardiac: RRR; no murmurs, rubs, or gallops,no edema  Respiratory:  clear to auscultation bilaterally, normal work of breathing GI: soft, nontender, nondistended, +  BS MS: no deformity or atrophy Skin: warm and dry, no rash Neuro:  Strength and sensation are intact Psych: euthymic mood, full affect   EKG:  EKG is not ordered today.    Recent Labs: 04/13/2015: ALT 12*; BUN 8; Creatinine, Ser 0.60; Hemoglobin 11.4*; Platelets 236; Potassium 3.8; Sodium 135    Lipid Panel No results found for: CHOL, TRIG, HDL, CHOLHDL, VLDL, LDLCALC, LDLDIRECT    Wt Readings from Last 3 Encounters:  05/02/15 167 lb 11.2 oz (76.068 kg)  04/18/15 163 lb (73.936 kg)  04/13/15 163 lb (73.936 kg)        ASSESSMENT AND PLAN:  1.  Palpitations - I will get an event monitor to assess 2.  SOB of unclear etiology - exam is benign and EKG is nonischemic.  Unlikely to be due to CAD given her age.  I will get a 2D echo to assess further. 3.  Chest pain that is very atypical and sound musculoskeletal    Current medicines are reviewed at length with the patient today.  The patient does not have concerns regarding medicines.  The following changes have been made:  no change  Labs/ tests ordered today: See above Assessment and Plan No orders of the defined types were placed in this encounter.     Disposition:   FU with me in 6 weeks  Signed, Sueanne Margarita, MD  05/02/2015 4:07 PM    [facility] [place], [place], [place]  [address] Phone: [phone]; Fax: [phone]

## 2015-05-02 NOTE — Patient Instructions (Signed)
Medication Instructions:  Your physician recommends that you continue on your current medications as directed. Please refer to the Current Medication list given to you today.  Labwork: None  Testing/Procedures: Your physician has requested that you have an echocardiogram. Echocardiography is a painless test that uses sound waves to create images of your heart. It provides your doctor with information about the size and shape of your heart and how well your heart's chambers and valves are working. This procedure takes approximately one hour. There are no restrictions for this procedure.   Your physician has recommended that you wear an event monitor. Event monitors are medical devices that record the heart's electrical activity. Doctors most often [place] these monitors to diagnose arrhythmias. Arrhythmias are problems with the speed or rhythm of the heartbeat. The monitor is a small, portable device. You can wear one while you do your normal daily activities. This is usually used to diagnose what is causing palpitations/syncope (passing out).  Follow-Up: Your physician recommends that you schedule a follow-up appointment in: 81 WEEKS with a PA or NP.  Any Other Special Instructions Will Be Listed Below (If Applicable).

## 2015-05-07 ENCOUNTER — Other Ambulatory Visit (HOSPITAL_COMMUNITY): Payer: Medicaid Other

## 2015-05-07 ENCOUNTER — Telehealth: Payer: Self-pay | Admitting: *Deleted

## 2015-05-07 NOTE — Telephone Encounter (Signed)
Pt called to office stating she has a cold and allergies and would like to know what to take during pregnancy,  Return call to pt.  Pt states that she is having a lot of head congestion, sore throat, stuffiness.   Pt advised of safe OTC med list for her symptoms.  Pt advised to try OTC meds for a few days.  Pt advised if symptoms no better in a few days to contact office to f/u with MD. Pt states understanding.

## 2015-05-10 ENCOUNTER — Ambulatory Visit (INDEPENDENT_AMBULATORY_CARE_PROVIDER_SITE_OTHER): Payer: Medicaid Other

## 2015-05-10 ENCOUNTER — Ambulatory Visit (HOSPITAL_COMMUNITY): Payer: Medicaid Other | Attending: Cardiology

## 2015-05-10 ENCOUNTER — Other Ambulatory Visit: Payer: Self-pay

## 2015-05-10 DIAGNOSIS — I517 Cardiomegaly: Secondary | ICD-10-CM | POA: Insufficient documentation

## 2015-05-10 DIAGNOSIS — Z87891 Personal history of nicotine dependence: Secondary | ICD-10-CM | POA: Diagnosis not present

## 2015-05-10 DIAGNOSIS — R002 Palpitations: Secondary | ICD-10-CM

## 2015-05-10 DIAGNOSIS — R0600 Dyspnea, unspecified: Secondary | ICD-10-CM | POA: Diagnosis present

## 2015-05-10 DIAGNOSIS — R42 Dizziness and giddiness: Secondary | ICD-10-CM

## 2015-05-10 DIAGNOSIS — R0602 Shortness of breath: Secondary | ICD-10-CM

## 2015-05-11 ENCOUNTER — Telehealth: Payer: Self-pay

## 2015-05-11 ENCOUNTER — Telehealth: Payer: Self-pay | Admitting: *Deleted

## 2015-05-11 NOTE — Telephone Encounter (Signed)
F/u ° ° °Pt returning your call °

## 2015-05-11 NOTE — Telephone Encounter (Signed)
Called to give pt echo results.lmtcb 

## 2015-05-11 NOTE — Telephone Encounter (Signed)
-----   Message from Sueanne Margarita, MD sent at 05/10/2015  5:41 PM EDT ----- Normal LVF with slightly enlarged RA/RV

## 2015-05-11 NOTE — Telephone Encounter (Signed)
Pt aware of echo results. Normal LVF with slightly enlarged RA/RV. Pt verbalized understanding with no further questions at this time

## 2015-05-11 NOTE — Telephone Encounter (Signed)
Patient requesting safe OTC medications for head congestion. Reviewed medications and patient verbalized understanding.

## 2015-05-16 ENCOUNTER — Ambulatory Visit (INDEPENDENT_AMBULATORY_CARE_PROVIDER_SITE_OTHER): Payer: Medicaid Other | Admitting: Certified Nurse Midwife

## 2015-05-16 DIAGNOSIS — Z3482 Encounter for supervision of other normal pregnancy, second trimester: Secondary | ICD-10-CM

## 2015-05-16 DIAGNOSIS — A499 Bacterial infection, unspecified: Secondary | ICD-10-CM

## 2015-05-16 DIAGNOSIS — J40 Bronchitis, not specified as acute or chronic: Secondary | ICD-10-CM

## 2015-05-16 DIAGNOSIS — B9689 Other specified bacterial agents as the cause of diseases classified elsewhere: Secondary | ICD-10-CM

## 2015-05-16 DIAGNOSIS — O09892 Supervision of other high risk pregnancies, second trimester: Secondary | ICD-10-CM

## 2015-05-16 DIAGNOSIS — N760 Acute vaginitis: Secondary | ICD-10-CM

## 2015-05-16 LAB — POCT URINALYSIS DIPSTICK
Bilirubin, UA: NEGATIVE
Glucose, UA: NEGATIVE
KETONES UA: NEGATIVE
LEUKOCYTES UA: NEGATIVE
Nitrite, UA: NEGATIVE
PH UA: 8
PROTEIN UA: NEGATIVE
RBC UA: 50
Spec Grav, UA: <=1.005
Urobilinogen, UA: NEGATIVE

## 2015-05-16 MED ORDER — DIPHENHYDRAMINE HCL 25 MG PO TABS: 25.0000 mg | ORAL_TABLET | Freq: Four times a day (QID) | ORAL | Status: DC | PRN

## 2015-05-16 MED ORDER — AMOXICILLIN-POT CLAVULANATE 875-125 MG PO TABS: 1.0000 | ORAL_TABLET | Freq: Two times a day (BID) | ORAL | Status: AC

## 2015-05-16 MED ORDER — ALBUTEROL SULFATE HFA 108 (90 BASE) MCG/ACT IN AERS
2.0000 | INHALATION_SPRAY | Freq: Four times a day (QID) | RESPIRATORY_TRACT | Status: DC | PRN
Start: 2015-05-16 — End: 2023-10-05

## 2015-05-16 MED ORDER — METRONIDAZOLE 500 MG PO TABS: 500.0000 mg | ORAL_TABLET | Freq: Two times a day (BID) | ORAL | Status: DC

## 2015-05-16 MED ORDER — GUAIFENESIN ER 1200 MG PO TB12: 1.0000 | ORAL_TABLET | Freq: Two times a day (BID) | ORAL | Status: DC

## 2015-05-16 NOTE — Progress Notes (Signed)
Subjective:    Lydia Blair is a 29 y.o. female being seen today for her obstetrical visit. She is at [redacted]w[redacted]d gestation. Patient reports: no bleeding, no contractions, no cramping, no leaking, vaginal irritation and has had a cold that has settled into her chest with coughing up yellow/green sputum.  Has tired OTC medicaitons.  Still has BV infection.  Is wearing cardiac monitor for 30 days per cardiology.  States she has a small right sided cardiomyopathy. Fetal movement: normal.  Problem List Items Addressed This Visit    None    Visit Diagnoses    Encounter for supervision of other normal pregnancy in second trimester    -  Primary    Relevant Orders    POCT urinalysis dipstick (Completed)    SureSwab, Vaginosis/Vaginitis Plus      Patient Active Problem List   Diagnosis Date Noted  . Cardiomyopathy 05/02/2015  . Dizziness 05/02/2015  . Palpitation 05/02/2015  . SOB (shortness of breath) 05/02/2015  . [redacted] weeks gestation of pregnancy   . Encounter for fetal anatomic survey   . Supervision of normal pregnancy 03/01/2015  . BV (bacterial vaginosis) 02/08/2014  . Vaginitis and vulvovaginitis, unspecified 06/02/2013   Objective:    LMP 12/17/2014 FHT: 150 BPM  Uterine Size: size equals dates     Assessment:    Pregnancy @ [redacted]w[redacted]d    URI/Bronchitis BV Right sided cardiomyopathy  Plan:  MFM consult for right sided cardiomyopathy, new dx.   Tx Bronchitis  OBGCT: discussed. Signs and symptoms of preterm labor: discussed.  Labs, problem list reviewed and updated 2 hr GTT planned Follow up in 3 weeks.

## 2015-05-17 ENCOUNTER — Telehealth: Payer: Self-pay | Admitting: *Deleted

## 2015-05-17 NOTE — Telephone Encounter (Signed)
Patient is calling to request a different medication. The Mucinex Rx is $36 and she would like something covered by her [facility].

## 2015-05-19 LAB — SURESWAB, VAGINOSIS/VAGINITIS PLUS
Atopobium vaginae: NOT DETECTED Log (cells/mL)
C. ALBICANS, DNA: NOT DETECTED
C. GLABRATA, DNA: NOT DETECTED
C. TRACHOMATIS RNA, TMA: NOT DETECTED
C. TROPICALIS, DNA: NOT DETECTED
C. parapsilosis, DNA: DETECTED — AB
Gardnerella vaginalis: 5.7 Log (cells/mL)
LACTOBACILLUS SPECIES: 7.1 Log (cells/mL)
MEGASPHAERA SPECIES: NOT DETECTED Log (cells/mL)
N. GONORRHOEAE RNA, TMA: NOT DETECTED
T. vaginalis RNA, QL TMA: NOT DETECTED

## 2015-05-22 ENCOUNTER — Encounter (HOSPITAL_COMMUNITY): Payer: Self-pay

## 2015-05-22 ENCOUNTER — Other Ambulatory Visit: Payer: Self-pay | Admitting: Certified Nurse Midwife

## 2015-05-22 ENCOUNTER — Ambulatory Visit (HOSPITAL_COMMUNITY)
Admission: RE | Admit: 2015-05-22 | Discharge: 2015-05-22 | Disposition: A | Discharge status: Home / Self Care | Payer: Medicaid Other | Source: Ambulatory Visit | Attending: Certified Nurse Midwife | Admitting: Certified Nurse Midwife

## 2015-05-22 DIAGNOSIS — O360121 Maternal care for anti-D [Rh] antibodies, second trimester, fetus 1: Secondary | ICD-10-CM

## 2015-05-22 DIAGNOSIS — O3421 Maternal care for scar from previous cesarean delivery: Secondary | ICD-10-CM | POA: Insufficient documentation

## 2015-05-22 DIAGNOSIS — B3731 Acute candidiasis of vulva and vagina: Secondary | ICD-10-CM

## 2015-05-22 DIAGNOSIS — O34219 Maternal care for unspecified type scar from previous cesarean delivery: Secondary | ICD-10-CM

## 2015-05-22 DIAGNOSIS — Z3482 Encounter for supervision of other normal pregnancy, second trimester: Secondary | ICD-10-CM

## 2015-05-22 DIAGNOSIS — B373 Candidiasis of vulva and vagina: Secondary | ICD-10-CM

## 2015-05-22 DIAGNOSIS — Z36 Encounter for antenatal screening of mother: Secondary | ICD-10-CM | POA: Diagnosis present

## 2015-05-22 DIAGNOSIS — O2692 Pregnancy related conditions, unspecified, second trimester: Secondary | ICD-10-CM | POA: Diagnosis not present

## 2015-05-22 DIAGNOSIS — Z3A22 22 weeks gestation of pregnancy: Secondary | ICD-10-CM

## 2015-05-22 DIAGNOSIS — O09892 Supervision of other high risk pregnancies, second trimester: Secondary | ICD-10-CM

## 2015-05-22 MED ORDER — FLUCONAZOLE 100 MG PO TABS: 100.0000 mg | ORAL_TABLET | Freq: Once | ORAL | Status: DC

## 2015-05-22 MED ORDER — TERCONAZOLE 0.4 % VA CREA: 1.0000 | TOPICAL_CREAM | Freq: Every day | VAGINAL | Status: DC

## 2015-05-22 NOTE — Progress Notes (Signed)
29 year old, [id number], currently at 22 weeks 2 days gestation seen in consultation for maternal cardiomyopathy. She notes no SOB, CP or DOE.   Past Medical History  Diagnosis Date  . Cardiomyopathy 2014  . Dermoid cyst of left orbit   . Prehypertension    Past Surgical History  Procedure Laterality Date  . Cesarean section    . Wisdom tooth extraction    . Incise and drain abcess     Family History  Problem Relation Age of Onset  . Diabetes Maternal Grandmother   . Hypertension Maternal Grandmother   . Hypertension Maternal Grandfather   . Hypertension Paternal Grandfather   . HIV Father   . Hypertension Sister    OB History    Gravida Para Term Preterm AB TAB SAB Ectopic Multiple Living   6 3 3  2 2    3      Allergies  Allergen Reactions  . Vicodin [Hydrocodone-Acetaminophen] Itching and Nausea And Vomiting   BP 107/76 pulse 76 weight 170.4  #1 Maternal Cardiomyopathy - The only time the patients remembers this being a problem was with her 2005 pregnancy for which she spent 3 months after delivery in the ICU. She thought this was related to her blood pressure. She did not have issues with cardiomyopathy in her subsequent term deliveries. She had a recent echo which I could not find in the EMR but she noted that it showed a question of right sided cardiomyopathy. She was not placed on any medications for this by her cardiologist, no do I see it recommended from the noted dated 05/02/15. She is currently on a one month Holter monitor.  - Baby is growing normally on ultrasound. Continue monthly evaluation of fetal growth and amniotic fluid. Consider an antepartum anesthesia consultation in preparation for her cesarean to establish what monitoring they would like for delivery at term - Generally if there is going to be issues it will occur around the third trimester up to 4-5 days or a week after delivery #2 History of cesarean delivery - She has had 3 prior cesareans and is  planning on a repeat cesarean #3 Undesired fertility - She has not discussed this with her providers yet. Recommended that she do this at her next prenatal visit and sign tubal papers if needed. #4 Low-lying placenta resolved #5 Ovarian mass that appears consistent with a Dermoid - ~3.6cm on today's ultrasound. Remove at time of cesarean or if rapidly enlarging  Questions appear answered to her satisfaction. Precautions for the above given. Spent greater than 1/2 of a 40 minute visit face to face counseling and record review.

## 2015-05-23 ENCOUNTER — Other Ambulatory Visit: Payer: Self-pay | Admitting: Certified Nurse Midwife

## 2015-05-23 ENCOUNTER — Other Ambulatory Visit: Payer: Self-pay | Admitting: *Deleted

## 2015-05-23 DIAGNOSIS — O2692 Pregnancy related conditions, unspecified, second trimester: Secondary | ICD-10-CM

## 2015-05-29 ENCOUNTER — Other Ambulatory Visit: Payer: Self-pay | Admitting: Certified Nurse Midwife

## 2015-05-31 ENCOUNTER — Other Ambulatory Visit: Payer: Self-pay | Admitting: *Deleted

## 2015-05-31 DIAGNOSIS — I429 Cardiomyopathy, unspecified: Secondary | ICD-10-CM

## 2015-06-04 ENCOUNTER — Other Ambulatory Visit: Payer: Self-pay | Admitting: Certified Nurse Midwife

## 2015-06-06 ENCOUNTER — Encounter: Payer: Medicaid Other | Admitting: Certified Nurse Midwife

## 2015-06-08 ENCOUNTER — Ambulatory Visit (INDEPENDENT_AMBULATORY_CARE_PROVIDER_SITE_OTHER): Payer: Medicaid Other | Admitting: Certified Nurse Midwife

## 2015-06-08 VITALS — BP 112/68 | HR 81 | Temp 98.1°F | Wt 173.0 lb

## 2015-06-08 DIAGNOSIS — Z3482 Encounter for supervision of other normal pregnancy, second trimester: Secondary | ICD-10-CM

## 2015-06-08 LAB — POCT URINALYSIS DIPSTICK
BILIRUBIN UA: NEGATIVE
Glucose, UA: NEGATIVE
KETONES UA: NEGATIVE
Leukocytes, UA: NEGATIVE
Nitrite, UA: NEGATIVE
PH UA: 7
Protein, UA: NEGATIVE
RBC UA: NEGATIVE
SPEC GRAV UA: 1.015
Urobilinogen, UA: NEGATIVE

## 2015-06-08 NOTE — Progress Notes (Signed)
Subjective:    Lydia Blair is a 29 y.o. female being seen today for her obstetrical visit. She is at [redacted]w[redacted]d gestation. Patient reports: no complaints . Fetal movement: normal.  Problem List Items Addressed This Visit    None    Visit Diagnoses    Encounter for supervision of other normal pregnancy in second trimester    -  Primary    Relevant Orders    POCT urinalysis dipstick (Completed)      Patient Active Problem List   Diagnosis Date Noted  . Cardiomyopathy 05/02/2015  . Dizziness 05/02/2015  . Palpitation 05/02/2015  . SOB (shortness of breath) 05/02/2015  . [redacted] weeks gestation of pregnancy   . Encounter for fetal anatomic survey   . Supervision of normal pregnancy 03/01/2015  . BV (bacterial vaginosis) 02/08/2014  . Vaginitis and vulvovaginitis, unspecified 06/02/2013   Objective:    BP 112/68 mmHg  Pulse 81  Temp(Src) 98.1 F (36.7 C)  Wt 173 lb (78.472 kg)  LMP 12/17/2014 FHT: 133 BPM  Uterine Size: size equals dates   Wearing halter monitor still.   Assessment:    High risk Pregnancy @ [redacted]w[redacted]d d/t slight right sided cardiomyopathy   Desires repeat c-section  Doing well.  Plan:   BTL paperwork signed.   OBGCT: ordered for next visit. Signs and symptoms of preterm labor: discussed.  Labs, problem list reviewed and updated 2 hr GTT planned next ROB Follow up in 3 weeks with Dr. Jodi Mourning to meet him.

## 2015-06-17 NOTE — Progress Notes (Signed)
Cardiology Office Note   Date:  06/18/2015   ID:  Zaela, Graley 23-Dec-1985, MRN [id number]  PCP:  Elizabeth Palau, MD  Cardiologist:  Dr. Fransico Him   Electrophysiologist:  n/a  Chief Complaint  Patient presents with  . Palpitations     History of Present Illness: Lydia Blair is a 29 y.o. female with a reported hx of Cardiomyopathy and post-partum HTN with pregnancy in 2005.  2D echo from 2005 showed normal LVF with mild to moderate MR and small pericardial effusion. She was recently evaluated by Dr. Fransico Him for palpitations, intermittent chest pain and dyspnea. PCP had seen her 1-2 years ago and told her she had an enlarged heart based up on her ECG.  She was to see cardiology but never did.  Patient was set up for an echocardiogram which demonstrated normal LV systolic and diastolic function. Event monitor was also arranged. Final report is still pending. Strips available for review in the patient's electronic chart demonstrates sinus rhythm, sinus tachycardia, PACs and motion artifact. No significant arrhythmias are noted on the strips available for my review.  Here for FU by herself. She still feels palpitations described as a flutter.  She also notes chest pain that is stable over the past 1 year.  She has not had any increase in symptoms.  They occur at rest.  She denies exertional chest pain.  She continues to be short of breath with some activities.  She denies syncope, orthopnea, PND, edema.     Studies/Reports Reviewed Today:  Event monitor 9/16 Preliminary:  NSR, sinus tachycardia, PAC, motion artifact - no significant arrhythmias  Echo 05/10/15 EF 60-65%, no RWMA, normal diastolic function, mild RVE, mild RAE  Echo 05/2004 EF 55-65%, mild to mod MR, mild LAE, effusion   Past Medical History  Diagnosis Date  . Cardiomyopathy 2014  . Dermoid cyst of left orbit   . Prehypertension     Past Surgical History  Procedure Laterality Date  .  Cesarean section    . Wisdom tooth extraction    . Incise and drain abcess       Current Outpatient Prescriptions  Medication Sig Dispense Refill  . albuterol (PROVENTIL HFA;VENTOLIN HFA) 108 (90 BASE) MCG/ACT inhaler Inhale 2 puffs into the lungs every 6 (six) hours as needed for wheezing or shortness of breath. 1 Inhaler 2  . Prenat-Fe Poly-Methfol-FA-DHA (VITAFOL ULTRA) 29-0.6-0.4-200 MG CAPS Take 1 tablet by mouth daily. 30 capsule 12   No current facility-administered medications for this visit.    Allergies:   Vicodin    Social History:  The patient  reports that she quit smoking about 4 months ago. She has never used smokeless tobacco. She reports that she does not drink alcohol or use illicit drugs.   Family History:  The patient's family history includes Diabetes in her maternal grandmother; HIV in her father; Hypertension in her maternal grandfather, maternal grandmother, paternal grandfather, and sister.    ROS:   Please see the history of present illness.   Review of Systems  HENT: Positive for headaches.   Cardiovascular: Positive for chest pain and irregular heartbeat.  Gastrointestinal: Positive for constipation.  Neurological: Positive for dizziness.  All other systems reviewed and are negative.     PHYSICAL EXAM: VS:  BP 104/60 mmHg  Pulse 72  Ht 5' 2.5" (1.588 m)  Wt 173 lb 12.8 oz (78.835 kg)  BMI 31.26 kg/m2  SpO2 98%  LMP 12/17/2014  Wt Readings from Last 3 Encounters:  06/18/15 173 lb 12.8 oz (78.835 kg)  06/08/15 173 lb (78.472 kg)  05/22/15 170 lb 6.4 oz (77.293 kg)     GEN: Well nourished, well developed, in no acute distress HEENT: normal Neck: no JVD,  no masses Cardiac:  Normal S1/S2, RRR; no murmur, no rubs or gallops, no edema   Respiratory:  clear to auscultation bilaterally, no wheezing, rhonchi or rales. GI: soft  MS: no deformity or atrophy Skin: warm and dry  Neuro:  CNs II-XII intact, Strength and sensation are  intact Psych: Normal affect   EKG:  EKG is ordered today.  It demonstrates:   NSR, HR 72, normal axis, no ST changes   Recent Labs: 04/13/2015: ALT 12*; BUN 8; Creatinine, Ser 0.60; Hemoglobin 11.4*; Platelets 236; Potassium 3.8; Sodium 135    Lipid Panel No results found for: CHOL, TRIG, HDL, CHOLHDL, VLDL, LDLCALC, LDLDIRECT    ASSESSMENT AND PLAN:  1. Palpitations:  I suspect she is having symptomatic PACs.  We discussed the benign nature of this.  I do not think she has had enough to warrant any medical Rx.  2. Chest Pain:  Atypical.  This has been stable for 1 year. Suspect MSK vs GI.  I have asked her to discuss with her Ob to determine a safe antacid.    3. Dyspnea:  Likely multifactorial and related to obesity and pregnancy.  Normal LV systolic and diastolic function on Echo.  She did have slightly enlarged RA and RV on echo.  I suspect this may be related to her pregnancy.  She may be at increased risk of OSA.  Will have her FU after delivery and consider repeat echo at that time.      Medication Changes: Current medicines are reviewed at length with the patient today.  Concerns regarding medicines are as outlined above.  The following changes have been made:   Discontinued Medications   BUTALBITAL-ACETAMINOPHEN-CAFFEINE (FIORICET, ESGIC) 50-325-40 MG PER TABLET    Take 1 tablet by mouth 2 (two) times daily as needed for headache.   DICLEGIS 10-10 MG TBEC    TAKE 1 TABLET WITH BREAKFAST, 1 TABLET WITH LUNCH AND 2 TABLETS AT [facility] TIME.   MECLIZINE (ANTIVERT) 25 MG TABLET    Take 1 tablet (25 mg total) by mouth 3 (three) times daily as needed for dizziness.   Modified Medications   No medications on file   New Prescriptions   No medications on file    Labs/ tests ordered today include:   Orders Placed This Encounter  Procedures  . EKG 12-Lead      Disposition:    FU with Dr. Fransico Him 4-5 mos.    Signed, Versie Starks, MHS 06/18/2015 5:01 PM     [facility] [place], [place],   [address] Phone: [phone]; Fax: [phone]

## 2015-06-18 ENCOUNTER — Ambulatory Visit (INDEPENDENT_AMBULATORY_CARE_PROVIDER_SITE_OTHER): Payer: Medicaid Other | Admitting: Physician Assistant

## 2015-06-18 ENCOUNTER — Encounter: Payer: Self-pay | Admitting: Physician Assistant

## 2015-06-18 VITALS — BP 104/60 | HR 72 | Ht 62.5 in | Wt 173.8 lb

## 2015-06-18 DIAGNOSIS — R0602 Shortness of breath: Secondary | ICD-10-CM | POA: Diagnosis not present

## 2015-06-18 DIAGNOSIS — R002 Palpitations: Secondary | ICD-10-CM

## 2015-06-18 DIAGNOSIS — R079 Chest pain, unspecified: Secondary | ICD-10-CM | POA: Diagnosis not present

## 2015-06-18 NOTE — Patient Instructions (Signed)
Medication Instructions:  Your physician recommends that you continue on your current medications as directed. Please refer to the Current Medication list given to you today.   Labwork: NONE  Testing/Procedures: NONE  Follow-Up: 4-5 MONTHS WITH DR. Radford Pax; WE WILL SEND OUT A REMINDER LETTER A COUPLE MONTHS EARLY SO THAT YOU MAY CALL AND MAKE YOUR APPT  Any Other Special Instructions Will Be Listed Below (If Applicable).

## 2015-06-19 ENCOUNTER — Ambulatory Visit (HOSPITAL_COMMUNITY)
Admission: RE | Admit: 2015-06-19 | Discharge: 2015-06-19 | Disposition: A | Discharge status: Home / Self Care | Payer: Medicaid Other | Source: Ambulatory Visit | Attending: Certified Nurse Midwife | Admitting: Certified Nurse Midwife

## 2015-06-19 ENCOUNTER — Other Ambulatory Visit: Payer: Self-pay | Admitting: Certified Nurse Midwife

## 2015-06-19 DIAGNOSIS — O2692 Pregnancy related conditions, unspecified, second trimester: Secondary | ICD-10-CM

## 2015-06-26 ENCOUNTER — Other Ambulatory Visit: Payer: Self-pay | Admitting: Certified Nurse Midwife

## 2015-06-26 ENCOUNTER — Other Ambulatory Visit (HOSPITAL_COMMUNITY): Payer: Self-pay | Admitting: *Deleted

## 2015-06-26 ENCOUNTER — Encounter (HOSPITAL_COMMUNITY): Payer: Self-pay

## 2015-06-26 ENCOUNTER — Ambulatory Visit (HOSPITAL_COMMUNITY)
Admission: RE | Admit: 2015-06-26 | Discharge: 2015-06-26 | Disposition: A | Discharge status: Home / Self Care | Payer: Medicaid Other | Source: Ambulatory Visit | Attending: Obstetrics | Admitting: Obstetrics

## 2015-06-26 DIAGNOSIS — Z3A27 27 weeks gestation of pregnancy: Secondary | ICD-10-CM

## 2015-06-26 DIAGNOSIS — O360121 Maternal care for anti-D [Rh] antibodies, second trimester, fetus 1: Secondary | ICD-10-CM

## 2015-06-26 DIAGNOSIS — O2692 Pregnancy related conditions, unspecified, second trimester: Secondary | ICD-10-CM

## 2015-06-26 DIAGNOSIS — O2690 Pregnancy related conditions, unspecified, unspecified trimester: Secondary | ICD-10-CM

## 2015-06-26 DIAGNOSIS — O26892 Other specified pregnancy related conditions, second trimester: Secondary | ICD-10-CM | POA: Diagnosis not present

## 2015-06-26 DIAGNOSIS — O34211 Maternal care for low transverse scar from previous cesarean delivery: Secondary | ICD-10-CM | POA: Diagnosis not present

## 2015-06-28 ENCOUNTER — Other Ambulatory Visit: Payer: Medicaid Other

## 2015-06-28 ENCOUNTER — Encounter: Payer: Self-pay | Admitting: Obstetrics

## 2015-06-28 ENCOUNTER — Ambulatory Visit (INDEPENDENT_AMBULATORY_CARE_PROVIDER_SITE_OTHER): Payer: Medicaid Other | Admitting: Obstetrics

## 2015-06-28 VITALS — BP 126/71 | HR 87 | Temp 97.7°F | Wt 172.0 lb

## 2015-06-28 DIAGNOSIS — Z3483 Encounter for supervision of other normal pregnancy, third trimester: Secondary | ICD-10-CM

## 2015-06-28 LAB — POCT URINALYSIS DIPSTICK
Bilirubin, UA: NEGATIVE
Blood, UA: NEGATIVE
GLUCOSE UA: NEGATIVE
Ketones, UA: NEGATIVE
LEUKOCYTES UA: NEGATIVE
NITRITE UA: NEGATIVE
PROTEIN UA: NEGATIVE
SPEC GRAV UA: 1.01
UROBILINOGEN UA: NEGATIVE
pH, UA: 7

## 2015-06-28 LAB — CBC
HEMATOCRIT: 36.3 % (ref 36.0–46.0)
Hemoglobin: 12.1 g/dL (ref 12.0–15.0)
MCH: 30.6 pg (ref 26.0–34.0)
MCHC: 33.3 g/dL (ref 30.0–36.0)
MCV: 91.7 fL (ref 78.0–100.0)
MPV: 10.6 fL (ref 8.6–12.4)
Platelets: 240 10*3/uL (ref 150–400)
RBC: 3.96 MIL/uL (ref 3.87–5.11)
RDW: 13.3 % (ref 11.5–15.5)
WBC: 7.8 10*3/uL (ref 4.0–10.5)

## 2015-06-28 NOTE — Progress Notes (Signed)
Subjective:    Lydia Blair is a 29 y.o. female being seen today for her obstetrical visit. She is at [redacted]w[redacted]d gestation. Patient reports: no complaints . Fetal movement: normal.  Problem List Items Addressed This Visit    None    Visit Diagnoses    Supervision of other normal pregnancy, antepartum, second trimester    -  Primary    Relevant Orders    Glucose Tolerance, 2 Hours w/1 Hour    CBC    HIV antibody    RPR    POCT urinalysis dipstick      Patient Active Problem List   Diagnosis Date Noted  . Cardiomyopathy ([place]) 05/02/2015  . Dizziness 05/02/2015  . Palpitation 05/02/2015  . SOB (shortness of breath) 05/02/2015  . [redacted] weeks gestation of pregnancy   . Encounter for fetal anatomic survey   . Supervision of normal pregnancy 03/01/2015  . BV (bacterial vaginosis) 02/08/2014  . Vaginitis and vulvovaginitis, unspecified 06/02/2013   Objective:    BP 126/71 mmHg  Pulse 87  Temp(Src) 97.7 F (36.5 C)  Wt 172 lb (78.019 kg)  LMP 12/17/2014 FHT: 150 BPM  Uterine Size: size equals dates     Assessment:    Pregnancy @ [redacted]w[redacted]d    Plan:    OBGCT: ordered.  Labs, problem list reviewed and updated 2 hr GTT planned Follow up in 2 weeks.

## 2015-06-29 ENCOUNTER — Other Ambulatory Visit: Payer: Self-pay | Admitting: Obstetrics

## 2015-06-29 ENCOUNTER — Other Ambulatory Visit: Payer: Self-pay | Admitting: *Deleted

## 2015-06-29 DIAGNOSIS — B3731 Acute candidiasis of vulva and vagina: Secondary | ICD-10-CM

## 2015-06-29 DIAGNOSIS — B373 Candidiasis of vulva and vagina: Secondary | ICD-10-CM

## 2015-06-29 LAB — GLUCOSE TOLERANCE, 2 HOURS W/ 1HR
GLUCOSE, FASTING: 72 mg/dL (ref 65–99)
Glucose, 1 hour: 118 mg/dL (ref 70–170)
Glucose, 2 hour: 110 mg/dL (ref 70–139)

## 2015-06-29 LAB — RPR

## 2015-06-29 LAB — HIV ANTIBODY (ROUTINE TESTING W REFLEX): HIV 1&2 Ab, 4th Generation: NONREACTIVE

## 2015-06-29 MED ORDER — TERCONAZOLE 0.4 % VA CREA: 1.0000 | TOPICAL_CREAM | Freq: Every day | VAGINAL | Status: DC

## 2015-07-12 ENCOUNTER — Ambulatory Visit (INDEPENDENT_AMBULATORY_CARE_PROVIDER_SITE_OTHER): Payer: Medicaid Other | Admitting: Certified Nurse Midwife

## 2015-07-12 VITALS — BP 114/73 | HR 73 | Temp 98.0°F | Wt 182.0 lb

## 2015-07-12 DIAGNOSIS — B9689 Other specified bacterial agents as the cause of diseases classified elsewhere: Secondary | ICD-10-CM

## 2015-07-12 DIAGNOSIS — B373 Candidiasis of vulva and vagina: Secondary | ICD-10-CM

## 2015-07-12 DIAGNOSIS — A499 Bacterial infection, unspecified: Secondary | ICD-10-CM

## 2015-07-12 DIAGNOSIS — Z3482 Encounter for supervision of other normal pregnancy, second trimester: Secondary | ICD-10-CM

## 2015-07-12 DIAGNOSIS — N760 Acute vaginitis: Secondary | ICD-10-CM

## 2015-07-12 DIAGNOSIS — B3731 Acute candidiasis of vulva and vagina: Secondary | ICD-10-CM

## 2015-07-12 LAB — POCT URINALYSIS DIPSTICK
Bilirubin, UA: NEGATIVE
Blood, UA: NEGATIVE
Glucose, UA: NEGATIVE
Ketones, UA: NEGATIVE
LEUKOCYTES UA: NEGATIVE
NITRITE UA: NEGATIVE
PROTEIN UA: NEGATIVE
Spec Grav, UA: <=1.005
Urobilinogen, UA: NEGATIVE
pH, UA: 7

## 2015-07-12 MED ORDER — METRONIDAZOLE 500 MG PO TABS: 500.0000 mg | ORAL_TABLET | Freq: Two times a day (BID) | ORAL | Status: DC

## 2015-07-12 MED ORDER — VITAFOL ULTRA 29-0.6-0.4-200 MG PO CAPS: 1.0000 | ORAL_CAPSULE | Freq: Every day | ORAL | Status: DC

## 2015-07-12 MED ORDER — FLUCONAZOLE 100 MG PO TABS
100.0000 mg | ORAL_TABLET | Freq: Once | ORAL | Status: DC
Start: 2015-07-12 — End: 2015-08-20

## 2015-07-12 MED ORDER — TERCONAZOLE 0.4 % VA CREA: 1.0000 | TOPICAL_CREAM | Freq: Every day | VAGINAL | Status: DC

## 2015-07-12 NOTE — Progress Notes (Signed)
Patient is having some back pain. Pain thinks she has yeast/BV- she has discahrge and irritation.

## 2015-07-12 NOTE — Progress Notes (Signed)
Subjective:    Lydia Blair is a 29 y.o. female being seen today for her obstetrical visit. She is at [redacted]w[redacted]d gestation. Patient reports backache, no bleeding, no contractions, no cramping, no leaking and vaginal irritation. Fetal movement: normal.  States she is having lower back pain after working.  Discussed use of pillows.  States she is not desiring for reduced work hours.  States for a few days she has had increased vaginal discharge with odor and occasional itching.  BTL paperwork was signed previously.  Rhogam was given in July at [facility] for some vaginal bleeding.    Problem List Items Addressed This Visit      Genitourinary   BV (bacterial vaginosis)   Relevant Medications   metroNIDAZOLE (FLAGYL) 500 MG tablet   fluconazole (DIFLUCAN) 100 MG tablet   Other Relevant Orders   SureSwab, Vaginosis/Vaginitis Plus    Other Visit Diagnoses    Encounter for supervision of other normal pregnancy in second trimester    -  Primary    Relevant Medications    Prenat-Fe Poly-Methfol-FA-DHA (VITAFOL ULTRA) 29-0.6-0.4-200 MG CAPS    Other Relevant Orders    POCT urinalysis dipstick (Completed)    Vulvovaginal candidiasis        Relevant Medications    metroNIDAZOLE (FLAGYL) 500 MG tablet    fluconazole (DIFLUCAN) 100 MG tablet    terconazole (TERAZOL 7) 0.4 % vaginal cream    Other Relevant Orders    SureSwab, Vaginosis/Vaginitis Plus      Patient Active Problem List   Diagnosis Date Noted  . Cardiomyopathy ([place]) 05/02/2015  . Dizziness 05/02/2015  . Palpitation 05/02/2015  . SOB (shortness of breath) 05/02/2015  . [redacted] weeks gestation of pregnancy   . Encounter for fetal anatomic survey   . Supervision of normal pregnancy 03/01/2015  . BV (bacterial vaginosis) 02/08/2014  . Vaginitis and vulvovaginitis, unspecified 06/02/2013   Objective:    BP 114/73 mmHg  Pulse 73  Temp(Src) 98 F (36.7 C)  Wt 182 lb (82.555 kg)  LMP 12/17/2014 FHT:  140 BPM  Uterine Size: size equals dates   Presentation: unsure     Assessment:    Pregnancy @ [redacted]w[redacted]d weeks   Lumbar back pain  VVC  BV  Plan:     labs reviewed, problem list updated Consent signed. GBS planning TDAP offered  Rhogam given for RH negative Pediatrician: discussed. Infant feeding: plans to breastfeed. Maternity leave: discussed. Cigarette smoking: quit at start of pregnancy. Orders Placed This Encounter  Procedures  . SureSwab, Vaginosis/Vaginitis Plus  . POCT urinalysis dipstick   Meds ordered this encounter  Medications  . Prenat-Fe Poly-Methfol-FA-DHA (VITAFOL ULTRA) 29-0.6-0.4-200 MG CAPS    Sig: Take 1 tablet by mouth daily.    Dispense:  30 capsule    Refill:  12  . metroNIDAZOLE (FLAGYL) 500 MG tablet    Sig: Take 1 tablet (500 mg total) by mouth 2 (two) times daily.    Dispense:  14 tablet    Refill:  0  . fluconazole (DIFLUCAN) 100 MG tablet    Sig: Take 1 tablet (100 mg total) by mouth once. Repeat dose in 48-72 hour.    Dispense:  3 tablet    Refill:  0  . terconazole (TERAZOL 7) 0.4 % vaginal cream    Sig: Place 1 applicator vaginally at bedtime.    Dispense:  45 g    Refill:  0   Follow up in 2 Weeks.

## 2015-07-18 LAB — SURESWAB, VAGINOSIS/VAGINITIS PLUS
ATOPOBIUM VAGINAE: NOT DETECTED Log (cells/mL)
BV CATEGORY: UNDETERMINED — AB
C. albicans, DNA: DETECTED — AB
C. glabrata, DNA: NOT DETECTED
C. parapsilosis, DNA: DETECTED — AB
C. trachomatis RNA, TMA: NOT DETECTED
C. tropicalis, DNA: NOT DETECTED
GARDNERELLA VAGINALIS: 6 Log (cells/mL)
LACTOBACILLUS SPECIES: DETECTED Log (cells/mL)
MEGASPHAERA SPECIES: NOT DETECTED Log (cells/mL)
N. gonorrhoeae RNA, TMA: NOT DETECTED
T. VAGINALIS RNA, QL TMA: NOT DETECTED

## 2015-07-19 ENCOUNTER — Other Ambulatory Visit: Payer: Self-pay | Admitting: Certified Nurse Midwife

## 2015-07-24 ENCOUNTER — Ambulatory Visit (HOSPITAL_COMMUNITY)
Admission: RE | Admit: 2015-07-24 | Discharge: 2015-07-24 | Disposition: A | Discharge status: Home / Self Care | Payer: Medicaid Other | Source: Ambulatory Visit | Attending: Obstetrics | Admitting: Obstetrics

## 2015-07-24 ENCOUNTER — Telehealth: Payer: Self-pay | Admitting: *Deleted

## 2015-07-24 VITALS — BP 119/70 | HR 79 | Wt 185.2 lb

## 2015-07-24 DIAGNOSIS — Z6791 Unspecified blood type, Rh negative: Secondary | ICD-10-CM | POA: Insufficient documentation

## 2015-07-24 DIAGNOSIS — O26893 Other specified pregnancy related conditions, third trimester: Secondary | ICD-10-CM | POA: Insufficient documentation

## 2015-07-24 DIAGNOSIS — Z3A31 31 weeks gestation of pregnancy: Secondary | ICD-10-CM | POA: Diagnosis not present

## 2015-07-24 DIAGNOSIS — I429 Cardiomyopathy, unspecified: Secondary | ICD-10-CM

## 2015-07-24 DIAGNOSIS — O34219 Maternal care for unspecified type scar from previous cesarean delivery: Secondary | ICD-10-CM | POA: Insufficient documentation

## 2015-07-24 DIAGNOSIS — O2690 Pregnancy related conditions, unspecified, unspecified trimester: Secondary | ICD-10-CM

## 2015-07-24 NOTE — Telephone Encounter (Signed)
Patient states she was told to have Dr Jodi Mourning start NST on Friday and she wants to know if anything is wrong. 5:14 Attempted to call patient back- unable to get through.

## 2015-07-27 ENCOUNTER — Encounter: Payer: Medicaid Other | Admitting: Certified Nurse Midwife

## 2015-08-03 ENCOUNTER — Encounter: Payer: Medicaid Other | Admitting: Certified Nurse Midwife

## 2015-08-07 ENCOUNTER — Encounter: Payer: Self-pay | Admitting: Obstetrics

## 2015-08-07 ENCOUNTER — Ambulatory Visit (INDEPENDENT_AMBULATORY_CARE_PROVIDER_SITE_OTHER): Payer: Medicaid Other | Admitting: Obstetrics

## 2015-08-07 ENCOUNTER — Encounter: Payer: Medicaid Other | Admitting: Certified Nurse Midwife

## 2015-08-07 VITALS — BP 115/76 | HR 89 | Wt 185.0 lb

## 2015-08-07 DIAGNOSIS — O903 Peripartum cardiomyopathy: Secondary | ICD-10-CM | POA: Diagnosis not present

## 2015-08-07 DIAGNOSIS — Z3483 Encounter for supervision of other normal pregnancy, third trimester: Secondary | ICD-10-CM

## 2015-08-07 LAB — POCT URINALYSIS DIPSTICK
BILIRUBIN UA: NEGATIVE
Blood, UA: NEGATIVE
Glucose, UA: NEGATIVE
KETONES UA: NEGATIVE
LEUKOCYTES UA: NEGATIVE
NITRITE UA: NEGATIVE
Protein, UA: NEGATIVE
Spec Grav, UA: <=1.005
Urobilinogen, UA: NEGATIVE
pH, UA: 6

## 2015-08-07 NOTE — Progress Notes (Signed)
Subjective:    Lydia Blair is a 29 y.o. female being seen today for her obstetrical visit. She is at [redacted]w[redacted]d gestation. Patient reports no complaints. Fetal movement: normal.  Problem List Items Addressed This Visit    None    Visit Diagnoses    Encounter for supervision of other normal pregnancy in third trimester    -  Primary    Relevant Orders    POCT urinalysis dipstick (Completed)      Patient Active Problem List   Diagnosis Date Noted  . Cardiomyopathy ([place]) 05/02/2015  . Dizziness 05/02/2015  . Palpitation 05/02/2015  . SOB (shortness of breath) 05/02/2015  . [redacted] weeks gestation of pregnancy   . Encounter for fetal anatomic survey   . Supervision of normal pregnancy 03/01/2015  . BV (bacterial vaginosis) 02/08/2014  . Vaginitis and vulvovaginitis, unspecified 06/02/2013   Objective:    BP 115/76 mmHg  Pulse 89  Wt 185 lb (83.915 kg)  LMP 12/17/2014 FHT:  150 BPM  Uterine Size: size equals dates  Presentation: unsure     NST:  Reactive  Assessment:    Pregnancy @ [redacted]w[redacted]d weeks   Plan:     labs reviewed, problem list updated Consent signed. GBS sent TDAP offered  Rhogam given for RH negative Pediatrician: discussed. Infant feeding: plans to breastfeed. Maternity leave: discussed. Cigarette smoking: former smoker. Orders Placed This Encounter  Procedures  . POCT urinalysis dipstick   No orders of the defined types were placed in this encounter.   Follow up in 1 Week.

## 2015-08-14 ENCOUNTER — Encounter: Payer: Medicaid Other | Admitting: Obstetrics

## 2015-08-20 ENCOUNTER — Ambulatory Visit (INDEPENDENT_AMBULATORY_CARE_PROVIDER_SITE_OTHER): Payer: Medicaid Other | Admitting: Certified Nurse Midwife

## 2015-08-20 ENCOUNTER — Telehealth: Payer: Self-pay

## 2015-08-20 VITALS — BP 124/73 | HR 84 | Temp 98.3°F | Wt 186.0 lb

## 2015-08-20 DIAGNOSIS — O0993 Supervision of high risk pregnancy, unspecified, third trimester: Secondary | ICD-10-CM | POA: Diagnosis not present

## 2015-08-20 DIAGNOSIS — I429 Cardiomyopathy, unspecified: Secondary | ICD-10-CM

## 2015-08-20 DIAGNOSIS — O360931 Maternal care for other rhesus isoimmunization, third trimester, fetus 1: Secondary | ICD-10-CM

## 2015-08-20 DIAGNOSIS — O903 Peripartum cardiomyopathy: Secondary | ICD-10-CM | POA: Diagnosis not present

## 2015-08-20 MED ORDER — RHO D IMMUNE GLOBULIN 1500 UNIT/2ML IJ SOSY
300.0000 ug | PREFILLED_SYRINGE | Freq: Once | INTRAMUSCULAR | Status: AC
  Administered 2015-08-20: 300 ug via INTRAMUSCULAR

## 2015-08-20 NOTE — Telephone Encounter (Signed)
[facility] SAID THEY NEEDED AN ORDER FOR THE CONSULT - UNFORTUNATELY, THEY DID NOT HAVE AN OPENING FOR TOMORROW, BUT DID ON 11/30, OK WITH PATIENT - JUST NEED ORDER -THANKS!

## 2015-08-20 NOTE — Progress Notes (Signed)
Rhogam given at today's visit for B- blood type.  Pt tolerated injection well.

## 2015-08-21 ENCOUNTER — Other Ambulatory Visit (HOSPITAL_COMMUNITY): Payer: Self-pay | Admitting: Maternal and Fetal Medicine

## 2015-08-21 ENCOUNTER — Ambulatory Visit (HOSPITAL_COMMUNITY)
Admission: RE | Admit: 2015-08-21 | Discharge: 2015-08-21 | Disposition: A | Discharge status: Home / Self Care | Payer: Medicaid Other | Source: Ambulatory Visit | Attending: Family Medicine | Admitting: Family Medicine

## 2015-08-21 ENCOUNTER — Other Ambulatory Visit: Payer: Self-pay | Admitting: Certified Nurse Midwife

## 2015-08-21 DIAGNOSIS — I429 Cardiomyopathy, unspecified: Secondary | ICD-10-CM | POA: Diagnosis not present

## 2015-08-21 DIAGNOSIS — Z3A35 35 weeks gestation of pregnancy: Secondary | ICD-10-CM | POA: Insufficient documentation

## 2015-08-21 DIAGNOSIS — O34219 Maternal care for unspecified type scar from previous cesarean delivery: Secondary | ICD-10-CM

## 2015-08-21 DIAGNOSIS — O99413 Diseases of the circulatory system complicating pregnancy, third trimester: Secondary | ICD-10-CM | POA: Diagnosis present

## 2015-08-21 NOTE — Progress Notes (Signed)
Subjective:    Lydia Blair is a 29 y.o. female being seen today for her obstetrical visit. She is at [redacted]w[redacted]d gestation. Patient reports no complaints and desires to know when she will have her C-section, no recommondation on file for delivery plans and post delivery care. Fetal movement: normal.  Problem List Items Addressed This Visit      Cardiovascular and Mediastinum   Cardiomyopathy ([place])   Relevant Orders   AMB Referral to Maternal Fetal Medicine (MFM)    Other Visit Diagnoses    Supervision of high risk pregnancy in third trimester    -  Primary    Relevant Medications    rho (d) immune globulin (RHIG/RHOPHYLAC) injection 300 mcg (Completed)    Other Relevant Orders    POCT urinalysis dipstick    AMB Referral to Maternal Fetal Medicine (MFM)    Strep B DNA probe    SureSwab, Vaginosis/Vaginitis Plus      Patient Active Problem List   Diagnosis Date Noted  . Cardiomyopathy ([place]) 05/02/2015  . Dizziness 05/02/2015  . Palpitation 05/02/2015  . SOB (shortness of breath) 05/02/2015  . [redacted] weeks gestation of pregnancy   . Encounter for fetal anatomic survey   . Supervision of normal pregnancy 03/01/2015  . BV (bacterial vaginosis) 02/08/2014  . Vaginitis and vulvovaginitis, unspecified 06/02/2013   Objective:    BP 124/73 mmHg  Pulse 84  Temp(Src) 98.3 F (36.8 C)  Wt 186 lb (84.369 kg)  LMP 12/17/2014 FHT:  135 BPM  Uterine Size: size equals dates  Presentation: cephalic   NST: cat. 1 tracing, + accels, no decels, mod. Variability.    Assessment:    Pregnancy @ [redacted]w[redacted]d weeks   Reactive NST  Rhogam given, RH neg. status  Plan:     labs reviewed, problem list updated Consent signed. GBS sent TDAP offered  Rhogam given for RH negative Pediatrician: discussed. Infant feeding: plans to breastfeed. Maternity leave: discussed. Cigarette smoking: quit at start of pregnancy. Orders Placed This Encounter  Procedures  . Strep B DNA probe  . SureSwab,  Vaginosis/Vaginitis Plus  . AMB Referral to Maternal Fetal Medicine (MFM)    Referral Priority:  Routine    Referral Type:  Consultation    Number of Visits Requested:  1  . POCT urinalysis dipstick   Meds ordered this encounter  Medications  . rho (d) immune globulin (RHIG/RHOPHYLAC) injection 300 mcg    Sig:    Follow up in 1 Week with NST.

## 2015-08-22 ENCOUNTER — Encounter (HOSPITAL_COMMUNITY): Payer: Medicaid Other

## 2015-08-22 LAB — STREP B DNA PROBE
GBSP: DETECTED
GBSP: DETECTED

## 2015-08-23 ENCOUNTER — Other Ambulatory Visit: Payer: Self-pay | Admitting: Certified Nurse Midwife

## 2015-08-23 LAB — SURESWAB, VAGINOSIS/VAGINITIS PLUS
Atopobium vaginae: NOT DETECTED Log (cells/mL)
C. PARAPSILOSIS, DNA: NOT DETECTED
C. TROPICALIS, DNA: NOT DETECTED
C. albicans, DNA: NOT DETECTED
C. glabrata, DNA: NOT DETECTED
C. trachomatis RNA, TMA: NOT DETECTED
Gardnerella vaginalis: 5.6 Log (cells/mL)
LACTOBACILLUS SPECIES: NOT DETECTED Log (cells/mL)
MEGASPHAERA SPECIES: NOT DETECTED Log (cells/mL)
N. gonorrhoeae RNA, TMA: NOT DETECTED
T. vaginalis RNA, QL TMA: NOT DETECTED

## 2015-08-24 ENCOUNTER — Encounter (HOSPITAL_COMMUNITY): Payer: Self-pay

## 2015-08-24 ENCOUNTER — Other Ambulatory Visit (HOSPITAL_COMMUNITY): Payer: Self-pay | Admitting: *Deleted

## 2015-08-24 ENCOUNTER — Ambulatory Visit (HOSPITAL_COMMUNITY)
Admission: RE | Admit: 2015-08-24 | Discharge: 2015-08-24 | Disposition: A | Discharge status: Home / Self Care | Payer: Medicaid Other | Source: Ambulatory Visit | Attending: Obstetrics & Gynecology | Admitting: Obstetrics & Gynecology

## 2015-08-24 VITALS — BP 129/77 | HR 93 | Wt 189.0 lb

## 2015-08-24 DIAGNOSIS — R002 Palpitations: Secondary | ICD-10-CM

## 2015-08-24 DIAGNOSIS — O10913 Unspecified pre-existing hypertension complicating pregnancy, third trimester: Secondary | ICD-10-CM

## 2015-08-24 DIAGNOSIS — I429 Cardiomyopathy, unspecified: Secondary | ICD-10-CM

## 2015-08-24 DIAGNOSIS — O99413 Diseases of the circulatory system complicating pregnancy, third trimester: Secondary | ICD-10-CM | POA: Diagnosis present

## 2015-08-24 DIAGNOSIS — Z3A35 35 weeks gestation of pregnancy: Secondary | ICD-10-CM | POA: Insufficient documentation

## 2015-08-24 NOTE — Progress Notes (Signed)
MFM consultation, Staff note:  I have reviewed this patient's history with available records.  I have additionally reviewed my partner's consultation (Dr. Lawerance Cruel) and agree with the recommendations as he has provided.  Given that the patient has a history of cardiomyopathy and history of hypertension, I would recommend that she have pre-operative consultation with anesthesia.  I would plan for her to have telemetry pre-operatively upon admission for delivery by repeat cesarean (patient is not a candidate for TOLAC with 3 prior c/s), intraoperatively and 12-24 hours postoperatively.  The rationale is that stress hormone release due to pain may potentially trigger pathologic arrhythmia.  Arrhythmia may lead to syncopal episodes with inherent mortality risk although I think highly unlikely.  Large fluid shifts as with hemorrhage incurred with cesarean delivery could indeed trigger arrhythmia.  Intra- and postoperatively the maternal cardiac status should be closely followed by vitals, clinical appearance, continuous pulse oximetry, and close matching of fluid status by I/O's (foley recommended x 12-24 hours post-operatively.  I will defer timing of and even need for postnatal echocardiogram to both cardiology and anesthesia.  Certainly, if she demonstrates clinical evidence (eg, low oxygen saturation and/or dyspnea with/without edematous changes and/or chest pain) to lead to suspicion for cardiomyopathy then echocardiogram would be warranted.    Summary of Recommendations: 1. Given patient report of history of hypertension requiring medication (hydrochlorothiazide) previously but not during pregnancy, she nonetheless should be managed as a pregnancy complicated by hx of hypertension not on medication by delivery at 38-39 weeks 2. Given hx HTN not on medication, I arranged for weekly BPP until delivery 3. Delivery by repeat cesarean section  4. Preeclampsia precautions 5. Patient uncertain of desire for  future pregnancy (not certain about sterilization) 6. Consider and recommend removal of ovarian dermoid at time of cesarean section 7. Pre-operative anesthesia consultation 8. Telemetry immediately pre-operatively, intraoperatively and 12-24 hours post-operatively 9. Intra- and postoperatively the maternal cardiac status should be closely followed by vitals, clinical appearance, continuous pulse oximetry, and close matching of fluid status by I/O's (foley recommended x 12-24 hours post-operatively. 10. Postoperative maternal echocardiogram if needed to be deferred to cardiology consultant's discretion 11. Postpartum follow up with cardiology at the cardiologist's discretion  Time Spent:  I spent in excess of 35 minutes in consultation with this patient to review records, evaluate her case, and provide her with an adequate discussion and education.  More than 50% of this time was spent in direct face-to-face counseling.  It was a pleasure seeing your patient in the office today.  Thank you for consultation. Please do not hesitate to contact our service for any further questions.   Thank you,  Delman Cheadle Harl Favor, Delman Cheadle, MD, MS, FACOG Assistant Professor [employer] [place]

## 2015-08-28 ENCOUNTER — Ambulatory Visit (INDEPENDENT_AMBULATORY_CARE_PROVIDER_SITE_OTHER): Payer: Medicaid Other | Admitting: Certified Nurse Midwife

## 2015-08-28 VITALS — BP 117/83 | HR 81 | Temp 98.4°F | Wt 190.0 lb

## 2015-08-28 DIAGNOSIS — O0993 Supervision of high risk pregnancy, unspecified, third trimester: Secondary | ICD-10-CM

## 2015-08-28 DIAGNOSIS — O903 Peripartum cardiomyopathy: Secondary | ICD-10-CM

## 2015-08-28 LAB — POCT URINALYSIS DIPSTICK
BILIRUBIN UA: NEGATIVE
Blood, UA: NEGATIVE
Glucose, UA: NEGATIVE
Leukocytes, UA: NEGATIVE
Nitrite, UA: NEGATIVE
PROTEIN UA: NEGATIVE
SPEC GRAV UA: 1.015
Urobilinogen, UA: NEGATIVE
pH, UA: 6

## 2015-08-28 NOTE — Progress Notes (Signed)
Subjective:    Lydia Blair is a 29 y.o. female being seen today for her obstetrical visit. She is at [redacted]w[redacted]d gestation. Patient reports no complaints. Fetal movement: normal.  Delivery plans discussed at length for [facility].    Problem List Items Addressed This Visit    None    Visit Diagnoses    Supervision of high risk pregnancy in third trimester    -  Primary    Relevant Orders    POCT urinalysis dipstick (Completed)      Patient Active Problem List   Diagnosis Date Noted  . Cardiomyopathy ([place]) 05/02/2015  . Dizziness 05/02/2015  . Palpitation 05/02/2015  . SOB (shortness of breath) 05/02/2015  . [redacted] weeks gestation of pregnancy   . Encounter for fetal anatomic survey   . Supervision of normal pregnancy 03/01/2015  . BV (bacterial vaginosis) 02/08/2014  . Vaginitis and vulvovaginitis, unspecified 06/02/2013   Objective:    BP 117/83 mmHg  Pulse 81  Temp(Src) 98.4 F (36.9 C)  Wt 190 lb (86.183 kg)  LMP 12/17/2014 FHT:  130 BPM  Uterine Size: size equals dates  Presentation: cephalic   NST: Cat. 1 tracing, + accels, no decels, + mod. variability no contractions on the monitor.    Assessment:    Pregnancy @ [redacted]w[redacted]d weeks   Doing well.  h/x of Cardiomyopathy  Repeat C-section  Reactive NST  Plan:     labs reviewed, problem list updated Consent signed. GBS results reviewed TDAP offered  Rhogam given for RH negative Pediatrician: discussed. Infant feeding: plans to breastfeed. Maternity leave: discussed. Cigarette smoking: never smoked. Orders Placed This Encounter  Procedures  . POCT urinalysis dipstick   No orders of the defined types were placed in this encounter.   Follow up in 1 Week with NST.

## 2015-08-30 ENCOUNTER — Encounter: Payer: Self-pay | Admitting: Cardiology

## 2015-08-31 ENCOUNTER — Ambulatory Visit (HOSPITAL_COMMUNITY)
Admission: RE | Admit: 2015-08-31 | Discharge: 2015-08-31 | Disposition: A | Discharge status: Home / Self Care | Payer: Medicaid Other | Source: Ambulatory Visit | Attending: Obstetrics & Gynecology | Admitting: Obstetrics & Gynecology

## 2015-08-31 ENCOUNTER — Encounter (HOSPITAL_COMMUNITY): Payer: Self-pay

## 2015-08-31 DIAGNOSIS — O2693 Pregnancy related conditions, unspecified, third trimester: Secondary | ICD-10-CM | POA: Diagnosis not present

## 2015-08-31 DIAGNOSIS — O10013 Pre-existing essential hypertension complicating pregnancy, third trimester: Secondary | ICD-10-CM | POA: Insufficient documentation

## 2015-08-31 DIAGNOSIS — Z3A36 36 weeks gestation of pregnancy: Secondary | ICD-10-CM | POA: Insufficient documentation

## 2015-08-31 DIAGNOSIS — O34219 Maternal care for unspecified type scar from previous cesarean delivery: Secondary | ICD-10-CM | POA: Insufficient documentation

## 2015-08-31 DIAGNOSIS — O10913 Unspecified pre-existing hypertension complicating pregnancy, third trimester: Secondary | ICD-10-CM

## 2015-08-31 DIAGNOSIS — O26893 Other specified pregnancy related conditions, third trimester: Secondary | ICD-10-CM | POA: Diagnosis present

## 2015-09-03 ENCOUNTER — Telehealth: Payer: Self-pay | Admitting: *Deleted

## 2015-09-03 NOTE — Telephone Encounter (Signed)
Patient states she felt some heart flutters and dizziness at work today. She wants to know what to do- if she should come today or wait until tomorrow. 3:59 Call to patient - She has gone home and she is feeling better. Told patient if she feels that again she needs to go to MAU so she can have an EKG to check her heart to make sure things are ok. Otherwise keep her appointment tomorrow.

## 2015-09-04 ENCOUNTER — Ambulatory Visit (INDEPENDENT_AMBULATORY_CARE_PROVIDER_SITE_OTHER): Payer: Medicaid Other | Admitting: Certified Nurse Midwife

## 2015-09-04 VITALS — BP 124/83 | HR 87 | Wt 188.0 lb

## 2015-09-04 DIAGNOSIS — O903 Peripartum cardiomyopathy: Secondary | ICD-10-CM

## 2015-09-04 DIAGNOSIS — O0993 Supervision of high risk pregnancy, unspecified, third trimester: Secondary | ICD-10-CM | POA: Diagnosis not present

## 2015-09-04 DIAGNOSIS — Z3483 Encounter for supervision of other normal pregnancy, third trimester: Secondary | ICD-10-CM

## 2015-09-04 LAB — POCT URINALYSIS DIPSTICK
BILIRUBIN UA: NEGATIVE
Glucose, UA: NEGATIVE
KETONES UA: NEGATIVE
Leukocytes, UA: NEGATIVE
Nitrite, UA: NEGATIVE
PH UA: 7
Protein, UA: NEGATIVE
RBC UA: NEGATIVE
Spec Grav, UA: 1.015
Urobilinogen, UA: NEGATIVE

## 2015-09-04 MED ORDER — DIPHENHYDRAMINE HCL 25 MG PO TABS: 50.0000 mg | ORAL_TABLET | Freq: Every day | ORAL | Status: DC

## 2015-09-04 MED ORDER — DIPHENHYDRAMINE HCL (SLEEP) 50 MG PO CAPS: 1.0000 | ORAL_CAPSULE | Freq: Every day | ORAL | Status: DC

## 2015-09-04 NOTE — Progress Notes (Signed)
Subjective:    Lydia Blair is a 29 y.o. female being seen today for her obstetrical visit. She is at [redacted]w[redacted]d gestation. Patient reports no complaints. Fetal movement: normal.  Problem List Items Addressed This Visit    None    Visit Diagnoses    Encounter for supervision of other normal pregnancy in third trimester    -  Primary    Relevant Orders    POCT urinalysis dipstick (Completed)      Patient Active Problem List   Diagnosis Date Noted  . Cardiomyopathy ([place]) 05/02/2015  . Dizziness 05/02/2015  . Palpitation 05/02/2015  . SOB (shortness of breath) 05/02/2015  . [redacted] weeks gestation of pregnancy   . Encounter for fetal anatomic survey   . Supervision of normal pregnancy 03/01/2015  . BV (bacterial vaginosis) 02/08/2014  . Vaginitis and vulvovaginitis, unspecified 06/02/2013    Objective:    BP 124/83 mmHg  Pulse 87  Wt 188 lb (85.276 kg)  LMP 12/17/2014 FHT: 130 BPM  Uterine Size: size equals dates  Presentations: cephalic  Pelvic Exam: deferred   NST: Cat. 1 tracing, + accels, no decels, moderate variability. No contractions noted.   Assessment:    Pregnancy @ [redacted]w[redacted]d weeks   Hx of cardiomyopathy  Reactive NST  Plan:   Plans for delivery: C/Section scheduled at [facility] for Monday 12/19; labs reviewed; problem list updated Counseling: Consent signed. Infant feeding: plans to breastfeed. Cigarette smoking: never smoked. L&D discussion: symptoms of labor, discussed when to call, discussed what number to call, anesthetic/analgesic options reviewed and delivering clinician:  plans Physician. Postpartum supports and preparation: circumcision discussed and contraception plans discussed.  Follow up in 3 weeks for her 2 week postpartum exam..

## 2015-09-05 DIAGNOSIS — O903 Peripartum cardiomyopathy: Secondary | ICD-10-CM | POA: Diagnosis not present

## 2015-09-05 DIAGNOSIS — O2693 Pregnancy related conditions, unspecified, third trimester: Secondary | ICD-10-CM | POA: Diagnosis not present

## 2015-09-05 DIAGNOSIS — O1002 Pre-existing essential hypertension complicating childbirth: Secondary | ICD-10-CM | POA: Diagnosis not present

## 2015-09-07 ENCOUNTER — Ambulatory Visit (HOSPITAL_COMMUNITY)
Admission: RE | Admit: 2015-09-07 | Discharge: 2015-09-07 | Disposition: A | Discharge status: Home / Self Care | Payer: Medicaid Other | Source: Ambulatory Visit | Attending: Family Medicine | Admitting: Family Medicine

## 2015-09-07 ENCOUNTER — Encounter (HOSPITAL_COMMUNITY): Payer: Self-pay

## 2015-09-07 DIAGNOSIS — O34219 Maternal care for unspecified type scar from previous cesarean delivery: Secondary | ICD-10-CM | POA: Diagnosis not present

## 2015-09-07 DIAGNOSIS — Z6791 Unspecified blood type, Rh negative: Secondary | ICD-10-CM | POA: Insufficient documentation

## 2015-09-07 DIAGNOSIS — O26893 Other specified pregnancy related conditions, third trimester: Secondary | ICD-10-CM | POA: Diagnosis present

## 2015-09-07 DIAGNOSIS — O10913 Unspecified pre-existing hypertension complicating pregnancy, third trimester: Secondary | ICD-10-CM

## 2015-09-07 DIAGNOSIS — O10013 Pre-existing essential hypertension complicating pregnancy, third trimester: Secondary | ICD-10-CM | POA: Diagnosis not present

## 2015-09-07 DIAGNOSIS — Z3A37 37 weeks gestation of pregnancy: Secondary | ICD-10-CM | POA: Insufficient documentation

## 2015-09-14 ENCOUNTER — Ambulatory Visit (HOSPITAL_COMMUNITY): Payer: Medicaid Other

## 2015-09-21 ENCOUNTER — Ambulatory Visit: Payer: Medicaid Other | Admitting: Certified Nurse Midwife

## 2015-09-24 ENCOUNTER — Encounter (HOSPITAL_COMMUNITY): Payer: Self-pay | Admitting: *Deleted

## 2015-09-24 ENCOUNTER — Inpatient Hospital Stay (HOSPITAL_COMMUNITY)
Admission: AD | Admit: 2015-09-24 | Discharge: 2015-09-28 | DRG: 776 | Disposition: A | Discharge status: Home / Self Care | Payer: Medicaid Other | Source: Ambulatory Visit | Attending: Obstetrics | Admitting: Obstetrics

## 2015-09-24 DIAGNOSIS — I10 Essential (primary) hypertension: Secondary | ICD-10-CM | POA: Insufficient documentation

## 2015-09-24 DIAGNOSIS — T814XXA Infection following a procedure, initial encounter: Secondary | ICD-10-CM

## 2015-09-24 DIAGNOSIS — IMO0001 Reserved for inherently not codable concepts without codable children: Secondary | ICD-10-CM

## 2015-09-24 DIAGNOSIS — L03311 Cellulitis of abdominal wall: Secondary | ICD-10-CM | POA: Diagnosis present

## 2015-09-24 DIAGNOSIS — L98499 Non-pressure chronic ulcer of skin of other sites with unspecified severity: Secondary | ICD-10-CM | POA: Diagnosis not present

## 2015-09-24 DIAGNOSIS — O8600 Infection of obstetric surgical wound, unspecified: Secondary | ICD-10-CM

## 2015-09-24 DIAGNOSIS — Z8679 Personal history of other diseases of the circulatory system: Secondary | ICD-10-CM

## 2015-09-24 DIAGNOSIS — Z98891 History of uterine scar from previous surgery: Secondary | ICD-10-CM | POA: Diagnosis not present

## 2015-09-24 DIAGNOSIS — O1003 Pre-existing essential hypertension complicating the puerperium: Secondary | ICD-10-CM | POA: Diagnosis present

## 2015-09-24 DIAGNOSIS — R509 Fever, unspecified: Secondary | ICD-10-CM | POA: Diagnosis not present

## 2015-09-24 DIAGNOSIS — L98491 Non-pressure chronic ulcer of skin of other sites limited to breakdown of skin: Secondary | ICD-10-CM

## 2015-09-24 DIAGNOSIS — Z87891 Personal history of nicotine dependence: Secondary | ICD-10-CM

## 2015-09-24 DIAGNOSIS — B9689 Other specified bacterial agents as the cause of diseases classified elsewhere: Secondary | ICD-10-CM | POA: Diagnosis not present

## 2015-09-24 DIAGNOSIS — L0390 Cellulitis, unspecified: Secondary | ICD-10-CM | POA: Diagnosis present

## 2015-09-24 DIAGNOSIS — R002 Palpitations: Secondary | ICD-10-CM | POA: Diagnosis present

## 2015-09-24 LAB — CBC WITH DIFFERENTIAL/PLATELET
BASOS ABS: 0 10*3/uL (ref 0.0–0.1)
Basophils Relative: 0 %
EOS ABS: 0.2 10*3/uL (ref 0.0–0.7)
EOS PCT: 2 %
HCT: 38.7 % (ref 36.0–46.0)
Hemoglobin: 12.7 g/dL (ref 12.0–15.0)
LYMPHS ABS: 0.4 10*3/uL — AB (ref 0.7–4.0)
Lymphocytes Relative: 6 %
MCH: 29.1 pg (ref 26.0–34.0)
MCHC: 32.8 g/dL (ref 30.0–36.0)
MCV: 88.8 fL (ref 78.0–100.0)
Monocytes Absolute: 0.4 10*3/uL (ref 0.1–1.0)
Monocytes Relative: 5 %
Neutro Abs: 6.4 10*3/uL (ref 1.7–7.7)
Neutrophils Relative %: 87 %
PLATELETS: 291 10*3/uL (ref 150–400)
RBC: 4.36 MIL/uL (ref 3.87–5.11)
RDW: 12.8 % (ref 11.5–15.5)
WBC: 7.4 10*3/uL (ref 4.0–10.5)

## 2015-09-24 LAB — COMPREHENSIVE METABOLIC PANEL
ALT: 13 U/L — AB (ref 14–54)
ANION GAP: 10 (ref 5–15)
AST: 17 U/L (ref 15–41)
Albumin: 4.1 g/dL (ref 3.5–5.0)
Alkaline Phosphatase: 98 U/L (ref 38–126)
BUN: 8 mg/dL (ref 6–20)
CHLORIDE: 104 mmol/L (ref 101–111)
CO2: 25 mmol/L (ref 22–32)
Calcium: 9.1 mg/dL (ref 8.9–10.3)
Creatinine, Ser: 0.83 mg/dL (ref 0.44–1.00)
GFR calc Af Amer: >60 mL/min (ref >60)
GFR calc non Af Amer: >60 mL/min (ref >60)
GLUCOSE: 88 mg/dL (ref 65–99)
POTASSIUM: 3.9 mmol/L (ref 3.5–5.1)
SODIUM: 139 mmol/L (ref 135–145)
Total Bilirubin: 0.9 mg/dL (ref 0.3–1.2)
Total Protein: 7.5 g/dL (ref 6.5–8.1)

## 2015-09-24 LAB — URINE MICROSCOPIC-ADD ON
BACTERIA UA: NONE SEEN
RBC / HPF: NONE SEEN RBC/hpf (ref 0–5)
WBC, UA: NONE SEEN WBC/hpf (ref 0–5)

## 2015-09-24 LAB — PROTEIN / CREATININE RATIO, URINE
Creatinine, Urine: 128 mg/dL
PROTEIN CREATININE RATIO: 0.05 mg/mg{creat} (ref 0.00–0.15)
TOTAL PROTEIN, URINE: 7 mg/dL

## 2015-09-24 LAB — URINALYSIS, ROUTINE W REFLEX MICROSCOPIC
Bilirubin Urine: NEGATIVE
GLUCOSE, UA: NEGATIVE mg/dL
Ketones, ur: NEGATIVE mg/dL
LEUKOCYTES UA: NEGATIVE
Nitrite: NEGATIVE
PH: 6 (ref 5.0–8.0)
Protein, ur: NEGATIVE mg/dL
SPECIFIC GRAVITY, URINE: 1.01 (ref 1.005–1.030)

## 2015-09-24 MED ORDER — SODIUM CHLORIDE 0.9 % IV SOLN: 2.0000 g | Freq: Once | INTRAVENOUS | Status: DC

## 2015-09-24 MED ORDER — VANCOMYCIN HCL IN DEXTROSE 1-5 GM/200ML-% IV SOLN
1000.0000 mg | Freq: Two times a day (BID) | INTRAVENOUS | Status: DC
  Administered 2015-09-24 – 2015-09-26 (×4): 1000 mg via INTRAVENOUS
  Filled 2015-09-24 (×4): qty 200

## 2015-09-24 MED ORDER — LACTATED RINGERS IV SOLN
INTRAVENOUS | Status: DC
  Administered 2015-09-24 – 2015-09-26 (×4): via INTRAVENOUS

## 2015-09-24 MED ORDER — SODIUM CHLORIDE 0.9 % IV SOLN: 2.0000 g | Freq: Four times a day (QID) | INTRAVENOUS | Status: DC

## 2015-09-24 MED ORDER — MAGNESIUM SULFATE BOLUS VIA INFUSION
4.0000 g | Freq: Once | INTRAVENOUS | Status: AC
  Administered 2015-09-24: 4 g via INTRAVENOUS
  Filled 2015-09-24: qty 500

## 2015-09-24 MED ORDER — PRENATAL MULTIVITAMIN CH
1.0000 | ORAL_TABLET | Freq: Every day | ORAL | Status: DC
  Administered 2015-09-25 – 2015-09-28 (×4): 1 via ORAL
  Filled 2015-09-24 (×4): qty 1

## 2015-09-24 MED ORDER — IBUPROFEN 600 MG PO TABS: 600.0000 mg | ORAL_TABLET | Freq: Four times a day (QID) | ORAL | Status: DC | PRN

## 2015-09-24 MED ORDER — ACETAMINOPHEN 500 MG PO TABS
1000.0000 mg | ORAL_TABLET | Freq: Once | ORAL | Status: AC
  Administered 2015-09-24: 1000 mg via ORAL
  Filled 2015-09-24: qty 2

## 2015-09-24 MED ORDER — MAGNESIUM SULFATE 50 % IJ SOLN
2.0000 g/h | INTRAVENOUS | Status: DC
  Administered 2015-09-24 – 2015-09-25 (×2): 2 g/h via INTRAVENOUS
  Filled 2015-09-24 (×2): qty 80

## 2015-09-24 MED ORDER — OXYCODONE HCL 5 MG PO TABS
5.0000 mg | ORAL_TABLET | Freq: Four times a day (QID) | ORAL | Status: DC | PRN
  Administered 2015-09-24 – 2015-09-28 (×7): 5 mg via ORAL
  Filled 2015-09-24 (×7): qty 1

## 2015-09-24 MED ORDER — ZOLPIDEM TARTRATE 5 MG PO TABS
5.0000 mg | ORAL_TABLET | Freq: Every day | ORAL | Status: DC
  Administered 2015-09-26 – 2015-09-27 (×2): 5 mg via ORAL
  Filled 2015-09-24 (×4): qty 1

## 2015-09-24 NOTE — Progress Notes (Signed)
ANTIBIOTIC CONSULT NOTE - INITIAL  Pharmacy Consult for Vancomycin Indication: Cellulits  Allergies  Allergen Reactions  . Vicodin [Hydrocodone-Acetaminophen] Itching and Nausea And Vomiting    Patient Measurements: Height: 5\' 2"  (157.5 cm) Weight: 163 lb 9.6 oz (74.208 kg) IBW/kg (Calculated) : 50.1   Vital Signs: Temp: 100.3 F (37.9 C) (01/02 1525) Temp Source: Oral (01/02 1525) BP: 151/94 mmHg (01/02 1715) Pulse Rate: 99 (01/02 1538)  Labs:  Recent Labs  09/24/15 1610  WBC 7.4  HGB 12.7  PLT 291  CREATININE 0.83   No results for input(s): VANCOTROUGH, VANCOPEAK, VANCORANDOM, GENTTROUGH, GENTPEAK, GENTRANDOM in the last 72 hours.   Microbiology: No results found for this or any previous visit (from the past 720 hour(s)).  Medications:    Assessment: 30 y.o. female [id number] s/p repeat C-section on 12/19 at [facility]. Pt presents to MAU with incision pain, increase in size of abdomen and chills. Pt states she had h/o of abscess with previous C-section requiring drains.  Goal of Therapy:  Vancomycin Trough 10-15 mg/L  Plan:  Vancomycin 1000 mg IV every 12 hrs  Will check vancomycin trough at steady-state or when clinically indicated. If no improvement or if wound becomes purulent, may want to consider adding Zosyn to regimen based on cellulitis- focused orders.  Vernie Ammons 09/24/2015,5:51 PM

## 2015-09-24 NOTE — MAU Note (Signed)
Report given to [facility] RN Antenatal patient to be admitted to room 151.

## 2015-09-24 NOTE — MAU Provider Note (Signed)
History     CSN: [id number]  Arrival date and time: 09/24/15 1509   First Provider Initiated Contact with Patient 09/24/15 1632      Chief Complaint  Patient presents with  . Incisional Pain  . Back Pain   HPI   Lydia Blair is a 30 y.o. female [phone] with history of cardiomyopathy who is status post repeat cesarean section on 12/19 presents with concerns about her incision, HA and back pain. The patient delivered at [facility] center. The symptoms started after delivery and the patient became concerned when her abdomen grew larger over the last week. The incision is sore to touch, worse on the right side. She has a history of an abscess with her first cesarean section and was admitted with drains. At times the incision drains brown fluid and has a bad odor "it smells like seafood."  She attests to elevated BP's in the last week of her pregnancy only. She was never on BP medications while pregnant.  The HA she is experiencing has been off and on for a few days. Nothing seems to make it better or worse, it is just there.   Unsure of fever at home, however does have chills that will not go away. " I just feel really bad."   OB History    Gravida Para Term Preterm AB TAB SAB Ectopic Multiple Living   _0 Past Medical History  Diagnosis Date  . Cardiomyopathy ([place]) 2014  . Dermoid cyst of left orbit   . Prehypertension     Past Surgical History  Procedure Laterality Date  . Cesarean section    . Wisdom tooth extraction    . Incise and drain abcess      Family History  Problem Relation Age of Onset  . Diabetes Maternal Grandmother   . Hypertension Maternal Grandmother   . Hypertension Maternal Grandfather   . Hypertension Paternal Grandfather   . HIV Father   . Hypertension Sister     Social History  Substance Use Topics  . Smoking status: Former Smoker    Quit date: 01/29/2015  . Smokeless tobacco: Never Used  . Alcohol Use: No     Allergies:  Allergies  Allergen Reactions  . Vicodin [Hydrocodone-Acetaminophen] Itching and Nausea And Vomiting    Prescriptions prior to admission  Medication Sig Dispense Refill Last Dose  . albuterol (PROVENTIL HFA;VENTOLIN HFA) 108 (90 BASE) MCG/ACT inhaler Inhale 2 puffs into the lungs every 6 (six) hours as needed for wheezing or shortness of breath. 1 Inhaler 2 Past Month at Unknown time  . diphenhydrAMINE (BENADRYL) 25 MG tablet Take 2 tablets (50 mg total) by mouth at bedtime. (Patient taking differently: Take 50 mg by mouth every 6 (six) hours as needed for allergies. ) 90 tablet 4 Past Month at Unknown time  . Oxycodone HCl 10 MG TABS Take 10 mg by mouth every 6 (six) hours as needed (pain).    09/23/2015 at 1900  . Prenat-Fe Poly-Methfol-FA-DHA (VITAFOL ULTRA) 29-0.6-0.4-200 MG CAPS Take 1 tablet by mouth daily. 30 capsule 12 Past Week at Unknown time   Results for orders placed or performed during the hospital encounter of 09/24/15 (from the past 48 hour(s))  Urinalysis, Routine w reflex microscopic (not at [facility])     Status: Abnormal   Collection Time: 09/24/15  3:30 PM  Result Value Ref Range   Color, Urine YELLOW YELLOW  APPearance CLEAR CLEAR   Specific Gravity, Urine 1.010 1.005 - 1.030   pH 6.0 5.0 - 8.0   Glucose, UA NEGATIVE NEGATIVE mg/dL   Hgb urine dipstick SMALL (A) NEGATIVE   Bilirubin Urine NEGATIVE NEGATIVE   Ketones, ur NEGATIVE NEGATIVE mg/dL   Protein, ur NEGATIVE NEGATIVE mg/dL   Nitrite NEGATIVE NEGATIVE   Leukocytes, UA NEGATIVE NEGATIVE  Urine microscopic-add on     Status: Abnormal   Collection Time: 09/24/15  3:30 PM  Result Value Ref Range   Squamous Epithelial / LPF 0-5 (A) NONE SEEN   WBC, UA NONE SEEN 0 - 5 WBC/hpf   RBC / HPF NONE SEEN 0 - 5 RBC/hpf   Bacteria, UA NONE SEEN NONE SEEN  CBC with Differential     Status: Abnormal   Collection Time: 09/24/15  4:10 PM  Result Value Ref Range   WBC 7.4 4.0 - 10.5 K/uL   RBC 4.36 3.87 -  5.11 MIL/uL   Hemoglobin 12.7 12.0 - 15.0 g/dL   HCT 38.7 36.0 - 46.0 %   MCV 88.8 78.0 - 100.0 fL   MCH 29.1 26.0 - 34.0 pg   MCHC 32.8 30.0 - 36.0 g/dL   RDW 12.8 11.5 - 15.5 %   Platelets 291 150 - 400 K/uL   Neutrophils Relative % 87 %   Neutro Abs 6.4 1.7 - 7.7 K/uL   Lymphocytes Relative 6 %   Lymphs Abs 0.4 (L) 0.7 - 4.0 K/uL   Monocytes Relative 5 %   Monocytes Absolute 0.4 0.1 - 1.0 K/uL   Eosinophils Relative 2 %   Eosinophils Absolute 0.2 0.0 - 0.7 K/uL   Basophils Relative 0 %   Basophils Absolute 0.0 0.0 - 0.1 K/uL  Comprehensive metabolic panel     Status: Abnormal   Collection Time: 09/24/15  4:10 PM  Result Value Ref Range   Sodium 139 135 - 145 mmol/L   Potassium 3.9 3.5 - 5.1 mmol/L   Chloride 104 101 - 111 mmol/L   CO2 25 22 - 32 mmol/L   Glucose, Bld 88 65 - 99 mg/dL   BUN 8 6 - 20 mg/dL   Creatinine, Ser 0.83 0.44 - 1.00 mg/dL   Calcium 9.1 8.9 - 10.3 mg/dL   Total Protein 7.5 6.5 - 8.1 g/dL   Albumin 4.1 3.5 - 5.0 g/dL   AST 17 15 - 41 U/L   ALT 13 (L) 14 - 54 U/L   Alkaline Phosphatase 98 38 - 126 U/L   Total Bilirubin 0.9 0.3 - 1.2 mg/dL   GFR calc non Af Amer >60 >60 mL/min   GFR calc Af Amer >60 >60 mL/min    Comment: (NOTE) The eGFR has been calculated using the CKD EPI equation. This calculation has not been validated in all clinical situations. eGFR's persistently <60 mL/min signify possible Chronic Kidney Disease.    Anion gap 10 5 - 15    Review of Systems  Constitutional: Positive for fever and chills.  Eyes: Negative for blurred vision.  Respiratory: Negative for shortness of breath.   Cardiovascular: Negative for chest pain.  Neurological: Positive for dizziness and headaches (Patient has not tried anything for HA.).   Physical Exam   Blood pressure 151/94, pulse 99, temperature 100.3 F (37.9 C), temperature source Oral, resp. rate 18, height _0  (1.575 m), weight 163 lb 9.6 oz (74.208 kg), unknown if currently  breastfeeding.  Patient Vitals for the past 24 hrs:  BP Temp  Temp src Pulse Resp Height Weight  09/24/15 1736 - - - - - _0  (1.575 m) 163 lb 9.6 oz (74.208 kg)  09/24/15 1715 151/94 mmHg - - - - - -  09/24/15 1647 141/78 mmHg - - - - - -  09/24/15 1601 148/95 mmHg - - - - - -  09/24/15 1538 150/97 mmHg - - 99 - - -  09/24/15 1525 (!) 156/101 mmHg 100.3 F (37.9 C) Oral 108 18 - -      Physical Exam  Constitutional: She is oriented to person, place, and time. She appears well-developed and well-nourished.  Non-toxic appearance. She has a sickly appearance. No distress.  HENT:  Head: Normocephalic.  Eyes: Pupils are equal, round, and reactive to light.  Neck: Neck supple.  Respiratory: Effort normal.  GI: Soft. There is tenderness.    Musculoskeletal: Normal range of motion. She exhibits no edema.  Neurological: She is alert and oriented to person, place, and time.  Reflex Scores:      Patellar reflexes are 3+ on the right side and 3+ on the left side. Negative clonus   Skin: Skin is warm. She is not diaphoretic.  Psychiatric: Her behavior is normal.    MAU Course  Procedures  None  MDM  UA CBC with diff CMP  Discussed labs, BP readings, HPI, and physical exam of wound with Dr. Ruthann Cancer  Dr. Ruthann Cancer agreeable to admitting the patient for IV antibiotics and magnesium. Further admission orders received.   Patient requests tylenol for HA   Assessment and Plan   A:  1. Wound cellulitis after surgery, initial encounter     P:  Admit for IV vancomycin and magnesium  Protein creatine ratio pending    Lezlie Lye, NP 09/24/2015 6:07 PM

## 2015-09-24 NOTE — MAU Note (Signed)
C/S on 12/19 @ [place], incision is burning & throbbing, is draining some.  This morning started having back pain, joint pain, HA.

## 2015-09-25 ENCOUNTER — Ambulatory Visit: Payer: Medicaid Other | Admitting: Certified Nurse Midwife

## 2015-09-25 MED ORDER — PROCHLORPERAZINE EDISYLATE 5 MG/ML IJ SOLN
10.0000 mg | Freq: Four times a day (QID) | INTRAMUSCULAR | Status: DC | PRN
  Administered 2015-09-25: 10 mg via INTRAVENOUS
  Filled 2015-09-25 (×3): qty 2

## 2015-09-25 MED ORDER — DIPHENHYDRAMINE HCL 50 MG/ML IJ SOLN
25.0000 mg | Freq: Four times a day (QID) | INTRAMUSCULAR | Status: DC | PRN
  Administered 2015-09-25: 25 mg via INTRAVENOUS
  Filled 2015-09-25: qty 1

## 2015-09-25 MED ORDER — KETOROLAC TROMETHAMINE 60 MG/2ML IM SOLN
60.0000 mg | Freq: Once | INTRAMUSCULAR | Status: AC
  Administered 2015-09-25: 60 mg via INTRAMUSCULAR
  Filled 2015-09-25 (×2): qty 2

## 2015-09-25 NOTE — H&P (Signed)
This is Dr. Gracy Racer dictating the history and physical on  Lydia Blair  she is a 30 year old gravida 6 para 3024 who had a C-section at [facility] on December 19 wishes was referred because of a cardiomyopathy and the patient came here because of cellulitis of her incision and headache she also had elevated blood pressures when she was seen here and she was started on magnesium sulfate 4 g loading 2 g an hour and she was started on vancomycin because of cellulitis to her incision Past medical history history of cardiomyopathy not in any medication Past surgical history C-section past Social history negative System review negative Physical exam well-developed female in no distress HEENT negative Lungs clear to P&A Heart regular rhythm no murmurs no gallops Breasts negative Abdomen incision red and tender patient said was draining has not drained since she's been here Pelvic deferred Extremities negative

## 2015-09-26 ENCOUNTER — Ambulatory Visit (HOSPITAL_COMMUNITY): Payer: Medicaid Other

## 2015-09-26 ENCOUNTER — Encounter (HOSPITAL_COMMUNITY): Payer: Self-pay | Admitting: Cardiology

## 2015-09-26 DIAGNOSIS — L03311 Cellulitis of abdominal wall: Secondary | ICD-10-CM

## 2015-09-26 DIAGNOSIS — B9689 Other specified bacterial agents as the cause of diseases classified elsewhere: Secondary | ICD-10-CM

## 2015-09-26 DIAGNOSIS — R509 Fever, unspecified: Secondary | ICD-10-CM

## 2015-09-26 DIAGNOSIS — I10 Essential (primary) hypertension: Secondary | ICD-10-CM

## 2015-09-26 DIAGNOSIS — Z8679 Personal history of other diseases of the circulatory system: Secondary | ICD-10-CM

## 2015-09-26 DIAGNOSIS — Z98891 History of uterine scar from previous surgery: Secondary | ICD-10-CM

## 2015-09-26 DIAGNOSIS — R002 Palpitations: Secondary | ICD-10-CM | POA: Insufficient documentation

## 2015-09-26 LAB — VANCOMYCIN, TROUGH: Vancomycin Tr: 5 ug/mL — ABNORMAL LOW (ref 10.0–20.0)

## 2015-09-26 MED ORDER — POLYETHYLENE GLYCOL 3350 17 G PO PACK: 17.0000 g | PACK | Freq: Every day | ORAL | Status: DC | PRN

## 2015-09-26 MED ORDER — VANCOMYCIN HCL IN DEXTROSE 1-5 GM/200ML-% IV SOLN
1000.0000 mg | Freq: Three times a day (TID) | INTRAVENOUS | Status: DC
  Filled 2015-09-26 (×2): qty 200

## 2015-09-26 MED ORDER — MAGNESIUM HYDROXIDE 400 MG/5ML PO SUSP: 30.0000 mL | Freq: Every day | ORAL | Status: DC | PRN

## 2015-09-26 MED ORDER — CEFAZOLIN SODIUM-DEXTROSE 2-3 GM-% IV SOLR
2.0000 g | Freq: Three times a day (TID) | INTRAVENOUS | Status: DC
  Administered 2015-09-26 – 2015-09-28 (×6): 2 g via INTRAVENOUS
  Filled 2015-09-26 (×7): qty 50

## 2015-09-26 MED ORDER — DOCUSATE SODIUM 100 MG PO CAPS
100.0000 mg | ORAL_CAPSULE | Freq: Two times a day (BID) | ORAL | Status: DC
  Administered 2015-09-26 – 2015-09-28 (×5): 100 mg via ORAL
  Filled 2015-09-26 (×5): qty 1

## 2015-09-26 NOTE — Progress Notes (Signed)
HPI:   Lydia Blair is a 30 year old gravida 6 para 3024 who had a C-section at [facility] on December 19 due to prior hx of cardiomyopathy per MFM recommendations.  She was admitted to [facility] on 09/25/15 because of cellulitis in her incision, along with elevated blood pressures and headache.  At admission she was started on magnesium sulfate 4 g loading 2 g an hour and vancomycin d/t cellulitis of C-section incision.  Past medical history history of cardiomyopathy not on any medications, was evaluated in September 2016 with cardiology.  Past surgical history: C-section  Social history negative System review negative   Subjective: Patient reports incisional pain, tolerating PO and no problems voiding.  States it feels like something is stabbing her from the inside along the incision.    Objective: I have reviewed patient's vital signs, intake and output, medications and labs.  General: alert, cooperative and no distress Resp: clear to auscultation bilaterally Cardio: regular rate and rhythm, S1, S2 normal, no murmur, click, rub or gallop GI: soft, non-tender; bowel sounds normal; no masses,  no organomegaly and incision: dry, erythematous and skin edema on right side towards umbilicus with warmth Extremities: extremities normal, atraumatic, no cyanosis or edema, Homans sign is negative, no sign of DVT and no edema, redness or tenderness in the calves or thighs Vaginal Bleeding: minimal   Assessment/Plan: Cellulitis,  Cardiology and Infectious disease consults ordered.   Consider addition of Zocyn for gram + coverage in addition to Vancomycin.  Along with CT scan for abscess if warranted.     LOS: 2 days    Rockelle Heuerman A Evadean Sproule 09/26/2015, 10:28 AM

## 2015-09-26 NOTE — Progress Notes (Addendum)
Pharmacy Antibiotic Follow-up Note  Lydia Blair is a 30 y.o. year-old female admitted on 09/24/2015.  The patient is currently on day 2 of unknown for cellulitis.  Pt currently afebrile, per RN, incision looks good - not draining.  However, per [facility], area of cellulitis is enlarging.  Assessment/Plan: - Vancomycin trough less than goal. - Increase vancomycin to 1000 mg IV q8h.  Recheck level at new steadystate pending ID consult. - Spoke with CNM Rachelle to discuss antibioic plans.  Agreed to order zosyn to provide broader coverage until ID recommendations made.  Temp (24hrs), Avg:99.3 F (37.4 C), Min:98.6 F (37 C), Max:100.4 F (38 C)   Recent Labs Lab 09/24/15 1610  WBC 7.4    Recent Labs Lab 09/24/15 1610  CREATININE 0.83   Estimated Creatinine Clearance: 92.6 mL/min (by C-G formula based on Cr of 0.83).    Allergies  Allergen Reactions  . Vicodin [Hydrocodone-Acetaminophen] Itching and Nausea And Vomiting    Antimicrobials this admission: Vancomycin 1g IV q12h >> 09/24/15@1829    Levels/dose changes this admission: Vanc level 5 mcg/mL on 09/26/15 @ 0530; goal 10-15 mcg/mL  Microbiology results: None  Thank you for allowing pharmacy to be a part of this patient's care.  Vonda Antigua PharmD 09/26/2015 9:33 AM

## 2015-09-26 NOTE — Consult Note (Signed)
Reason for Consult: cardiomyopathy    Referring Physician: Dr. Jodi Mourning   PCP:  Elizabeth Palau, MD  Primary Cardiologist:Dr. Elwanda Blair is an 30 y.o. female.    Chief Complaint: admitted    HPI:   Asked to see 31 year old female for her hx of cardiomyopathy.  She has history of cardiomyopathy and postpartum HTN with prior pregnancy in 2005 and is now at [facility] for cellulitis of her incision and headache.  She had a C-section at [facility] on 09/10/15 and Lt ovarian cystectomy- dermoid.   Her BP was elevated on admit at 150/97.  She rec'd magnesium 4 gm and 2 gm an hour and IV vancomycin for her incisional cellulitis.    2D echo from 2005 showed normal LVF with mild to moderate MR and small pericardial effusion. Apparently she had an EKG 1-2 years ago by her PCP and was told her heart was enlarged and was supposed to referred to Cardiology at that time but never was.  Last seen by Dr. Radford Pax 05/02/15  At that time she was complaining of SOB and palpitations. Event monitor with SR, ST  HR ranging from 54-120, and PACs  PVC and motion artifact.  Echo from August 2016:  - Left ventricle: The cavity size was normal. Systolic function was normal. The estimated ejection fraction was in the range of 60% to 65%. Wall motion was normal; there were no regional wall motion abnormalities. Left ventricular diastolic function parameters were normal. - Right ventricle: The cavity size was mildly dilated. Wall thickness was normal. - Right atrium: The atrium was mildly dilated.--no changes in treatment were planned.  I&O since admit now +936, labs stable.  No EKG -last office EKG in Sept was NSR. No complaints now.  Her greatest complaint was dizziness and headache.  She still has some headache.  No chest pain.  Lying flat in bed without SOB.   Past Medical History  Diagnosis Date  . Cardiomyopathy ([place]) 2014  . Dermoid cyst of left orbit   . Prehypertension      Past Surgical History  Procedure Laterality Date  . Cesarean section    . Wisdom tooth extraction    . Incise and drain abcess      Family History  Problem Relation Age of Onset  . Diabetes Maternal Grandmother   . Hypertension Maternal Grandmother   . Hypertension Maternal Grandfather   . Hypertension Paternal Grandfather   . HIV Father   . Hypertension Sister    Social History:  reports that she quit smoking about 7 months ago. She has never used smokeless tobacco. She reports that she does not drink alcohol or use illicit drugs.  Allergies:  Allergies  Allergen Reactions  . Vicodin [Hydrocodone-Acetaminophen] Itching and Nausea And Vomiting    OUTPATIENT MEDICATIONS: No current facility-administered medications on file prior to encounter.   Current Outpatient Prescriptions on File Prior to Encounter  Medication Sig Dispense Refill  . albuterol (PROVENTIL HFA;VENTOLIN HFA) 108 (90 BASE) MCG/ACT inhaler Inhale 2 puffs into the lungs every 6 (six) hours as needed for wheezing or shortness of breath. 1 Inhaler 2  . diphenhydrAMINE (BENADRYL) 25 MG tablet Take 2 tablets (50 mg total) by mouth at bedtime. (Patient taking differently: Take 50 mg by mouth every 6 (six) hours as needed for allergies. ) 90 tablet 4  . Prenat-Fe Poly-Methfol-FA-DHA (VITAFOL ULTRA) 29-0.6-0.4-200 MG CAPS Take 1 tablet by mouth daily. 30 capsule  12   CURRENT MEDICATIONS: Scheduled Meds: . docusate sodium  100 mg Oral BID  . prenatal multivitamin  1 tablet Oral Q1200  . vancomycin  1,000 mg Intravenous Q8H  . zolpidem  5 mg Oral QHS   Continuous Infusions: . lactated ringers 50 mL/hr at 09/26/15 0807   PRN Meds:.diphenhydrAMINE, magnesium hydroxide, oxyCODONE, polyethylene glycol, prochlorperazine     Results for orders placed or performed during the hospital encounter of 09/24/15 (from the past 48 hour(s))  Urinalysis, Routine w reflex microscopic (not at [facility])     Status: Abnormal    Collection Time: 09/24/15  3:30 PM  Result Value Ref Range   Color, Urine YELLOW YELLOW   APPearance CLEAR CLEAR   Specific Gravity, Urine 1.010 1.005 - 1.030   pH 6.0 5.0 - 8.0   Glucose, UA NEGATIVE NEGATIVE mg/dL   Hgb urine dipstick SMALL (A) NEGATIVE   Bilirubin Urine NEGATIVE NEGATIVE   Ketones, ur NEGATIVE NEGATIVE mg/dL   Protein, ur NEGATIVE NEGATIVE mg/dL   Nitrite NEGATIVE NEGATIVE   Leukocytes, UA NEGATIVE NEGATIVE  Urine microscopic-add on     Status: Abnormal   Collection Time: 09/24/15  3:30 PM  Result Value Ref Range   Squamous Epithelial / LPF 0-5 (A) NONE SEEN   WBC, UA NONE SEEN 0 - 5 WBC/hpf   RBC / HPF NONE SEEN 0 - 5 RBC/hpf   Bacteria, UA NONE SEEN NONE SEEN  Protein / creatinine ratio, urine     Status: None   Collection Time: 09/24/15  3:30 PM  Result Value Ref Range   Creatinine, Urine 128.00 mg/dL   Total Protein, Urine 7 mg/dL    Comment: NO NORMAL RANGE ESTABLISHED FOR THIS TEST   Protein Creatinine Ratio 0.05 0.00 - 0.15 mg/mg[Cre]  CBC with Differential     Status: Abnormal   Collection Time: 09/24/15  4:10 PM  Result Value Ref Range   WBC 7.4 4.0 - 10.5 K/uL   RBC 4.36 3.87 - 5.11 MIL/uL   Hemoglobin 12.7 12.0 - 15.0 g/dL   HCT 38.7 36.0 - 46.0 %   MCV 88.8 78.0 - 100.0 fL   MCH 29.1 26.0 - 34.0 pg   MCHC 32.8 30.0 - 36.0 g/dL   RDW 12.8 11.5 - 15.5 %   Platelets 291 150 - 400 K/uL   Neutrophils Relative % 87 %   Neutro Abs 6.4 1.7 - 7.7 K/uL   Lymphocytes Relative 6 %   Lymphs Abs 0.4 (L) 0.7 - 4.0 K/uL   Monocytes Relative 5 %   Monocytes Absolute 0.4 0.1 - 1.0 K/uL   Eosinophils Relative 2 %   Eosinophils Absolute 0.2 0.0 - 0.7 K/uL   Basophils Relative 0 %   Basophils Absolute 0.0 0.0 - 0.1 K/uL  Comprehensive metabolic panel     Status: Abnormal   Collection Time: 09/24/15  4:10 PM  Result Value Ref Range   Sodium 139 135 - 145 mmol/L   Potassium 3.9 3.5 - 5.1 mmol/L   Chloride 104 101 - 111 mmol/L   CO2 25 22 - 32 mmol/L    Glucose, Bld 88 65 - 99 mg/dL   BUN 8 6 - 20 mg/dL   Creatinine, Ser 0.83 0.44 - 1.00 mg/dL   Calcium 9.1 8.9 - 10.3 mg/dL   Total Protein 7.5 6.5 - 8.1 g/dL   Albumin 4.1 3.5 - 5.0 g/dL   AST 17 15 - 41 U/L   ALT 13 (L) 14 -  54 U/L   Alkaline Phosphatase 98 38 - 126 U/L   Total Bilirubin 0.9 0.3 - 1.2 mg/dL   GFR calc non Af Amer >60 >60 mL/min   GFR calc Af Amer >60 >60 mL/min    Comment: (NOTE) The eGFR has been calculated using the CKD EPI equation. This calculation has not been validated in all clinical situations. eGFR's persistently <60 mL/min signify possible Chronic Kidney Disease.    Anion gap 10 5 - 15  Vancomycin, trough     Status: Abnormal   Collection Time: 09/26/15  5:30 AM  Result Value Ref Range   Vancomycin Tr 5 (L) 10.0 - 20.0 ug/mL    Comment: Performed at [facility]   No results found.  ROS: General:no colds or fevers, no weight changes Skin:no rashes or ulcers HEENT:no blurred vision, no congestion CV:see HPI PUL:see HPI GI:no diarrhea constipation or melena, no indigestion GU:no hematuria, no dysuria MS:no joint pain, no claudication Neuro:no syncope, + lightheadedness, + h/a Endo:no diabetes, no thyroid disease   Blood pressure 115/77, pulse 89, temperature 99 F (37.2 C), temperature source Oral, resp. rate 16, height '5\' 2"'$  (1.575 m), weight 167 lb (75.751 kg), SpO2 97 %, unknown if currently breastfeeding.  Wt Readings from Last 3 Encounters:  09/26/15 167 lb (75.751 kg)  09/07/15 190 lb (86.183 kg)  09/04/15 188 lb (85.276 kg)    PE: General:Pleasant affect, NAD Skin:Warm and dry, brisk capillary refill HEENT:normocephalic, sclera clear, mucus membranes moist Neck:supple, no JVD, no bruits  Heart:S1S2 RRR without murmur, gallup, rub or click Lungs:clear without rales, rhonchi, or wheezes PEA:KLTY,VDPB partum, non tender, + BS, do not palpate liver spleen or masses, abd dressing in place Ext:no lower ext edema, 2+ pedal pulses,  2+ radial pulses Neuro:alert and oriented X 3, MAE, follows commands, + facial symmetry    Assessment/Plan Active Problems:   Cellulitis- ID to see, on antibiotics   Hx of cardiomyopathy- HTN on admit this time, last Echo in august was with normal EF Dr. Debara Pickett to see but may repeat Echo to further eval with her HTN on admit- will check EKG as well.    [place]  Nurse Practitioner Certified [place] Pager [phone] or after 5pm or weekends call [phone] 09/26/2015, 1:09 PM

## 2015-09-26 NOTE — Progress Notes (Signed)
During handoff, it was brought to the attention that the pt likes to sleep in the bed with baby and the family likes to lay on the couch and sleep with baby.    When RN made introduction to pt and family.  RN explained that baby is not a pt here.  But it is appropriate for baby to be in the bassinet when pt is sleeping and neither pt or family needs to sleep with baby.    Upon hourly rounding multiple times tonight baby has either been in the bed with the pt sleeping, or on the edge of the couch with family sleeping.  RN has educated the dangers of this.  But due to baby was not born here and is not a pt here RN cannot take baby to nursery for baby safety.  RN can only educate the pt and family the dangers of sleeping with baby.

## 2015-09-26 NOTE — Consult Note (Addendum)
[place] for Infectious Disease  Total days of antibiotics 3        Day 3 vancomycin               Reason for Consult: abdominal wound/cellulitis s/p c-section on 12/19   Referring Physician: harper  Active Problems:   Cellulitis   Hx of cardiomyopathy    HPI: Lydia Blair is a 30 y.o. female with history of cardiomyopathy and postpartum HTN with prior pregnancy in 2005 and is now at [facility] for cellulitis of her incision and headache. She had a C-section at [facility] on 09/10/15 and Lt ovarian cystectomy- dermoid. She states that she had some incision pain as she has had on prior csections. In the past week, she has had headache and lightheadedness then on 12/30, she started to notice tender spot, warm to touch, that had brownish drainage. She tried topical hydrogen peroxide but still had worsening pain swelling to right sided lower abdomen. Due to worsening symptoms and now feeling feverish, she came to the ED for evaluation on 09/23/2014. She was found to have low grade fever up to 100.4. Wbc was not elevated but had slight left shift. Since being admitted, the patient states that she feels better with less lower abdominal pain, less induration to the area. She is pumping/dumping breastmilk while on vancomycin.  She reports that on a prior csection, she had developed lower abdominal abscess that required drainage. She is not allergic to any antibiotics, no hx of mrsa infection. She is planning on breastfeeding  Past Medical History  Diagnosis Date  . Cardiomyopathy ([place]) 2014  . Dermoid cyst of left orbit   . Prehypertension     Allergies:  Allergies  Allergen Reactions  . Vicodin [Hydrocodone-Acetaminophen] Itching and Nausea And Vomiting     MEDICATIONS: .  ceFAZolin (ANCEF) IV  2 g Intravenous 3 times per day  . docusate sodium  100 mg Oral BID  . prenatal multivitamin  1 tablet Oral Q1200  . zolpidem  5 mg Oral QHS    Social History  Substance Use Topics  .  Smoking status: Former Smoker    Quit date: 01/29/2015  . Smokeless tobacco: Never Used  . Alcohol Use: No    Family History  Problem Relation Age of Onset  . Diabetes Maternal Grandmother   . Hypertension Maternal Grandmother   . Hypertension Maternal Grandfather   . Hypertension Paternal Grandfather   . HIV Father   . Hypertension Sister    - family hx of cardiomyopathy.  Review of Systems  Constitutional: poistive for fever. Negative for fever, chills, diaphoresis, activity change, appetite change, fatigue and unexpected weight change.  HENT: Negative for congestion, sore throat, rhinorrhea, sneezing, trouble swallowing and sinus pressure.  Eyes: Negative for photophobia and visual disturbance.  Respiratory: Negative for cough, chest tightness, shortness of breath, wheezing and stridor.  Cardiovascular: Negative for chest pain, palpitations and leg swelling.  Gastrointestinal: + lower abdominal pain near c-section incision. Negative for nausea, vomiting, abdominal pain, diarrhea, constipation, blood in stool, abdominal distention and anal bleeding.  Genitourinary: Negative for dysuria, hematuria, flank pain and difficulty urinating.  Musculoskeletal: Negative for myalgias, back pain, joint swelling, arthralgias and gait problem.  Skin: + lower abdominal lesion, drainage. Negative for color change, pallor, rash and wound.  Neurological: Negative for dizziness, tremors, weakness and light-headedness.  Hematological: Negative for adenopathy. Does not bruise/bleed easily.  Psychiatric/Behavioral: Negative for behavioral problems, confusion, sleep disturbance, dysphoric mood, decreased concentration  and agitation.     OBJECTIVE: Temp:  [98.6 F (37 C)-100.4 F (38 C)] 99 F (37.2 C) (01/04 1207) Pulse Rate:  [69-133] 89 (01/04 1207) Resp:  [16-18] 16 (01/04 1207) BP: (108-135)/(57-82) 115/77 mmHg (01/04 1207) SpO2:  [93 %-100 %] 97 % (01/04 0700) Weight:  [167 lb (75.751 kg)]  167 lb (75.751 kg) (01/04 1248) Physical Exam  Constitutional:  oriented to person, place, and time. appears well-developed and well-nourished. No distress.  HENT: [place]/AT, PERRLA, no scleral icterus Mouth/Throat: Oropharynx is clear and moist. No oropharyngeal exudate.  Cardiovascular: Normal rate, regular rhythm and normal heart sounds. Exam reveals no gallop and no friction rub.  No murmur heard.  Pulmonary/Chest: Effort normal and breath sounds normal. No respiratory distress.  has no wheezes.  Neck = supple, no nuchal rigidity Abdominal: Soft. Bowel sounds are normal.  exhibits no distension. There is no tenderness. Low transverse c-section is intact, no drainage, though she has a midline superior ulcer, with central area of purulent exudate , 1.5-2cm wide. Tender to associated area, mild induration. No erythema Lymphadenopathy: no cervical adenopathy. No axillary adenopathy Neurological: alert and oriented to person, place, and time.  Skin: Skin is warm and dry. No rash noted. No erythema.  Psychiatric: a normal mood and affect.  behavior is normal.   LABS: Results for orders placed or performed during the hospital encounter of 09/24/15 (from the past 48 hour(s))  Urinalysis, Routine w reflex microscopic (not at [facility])     Status: Abnormal   Collection Time: 09/24/15  3:30 PM  Result Value Ref Range   Color, Urine YELLOW YELLOW   APPearance CLEAR CLEAR   Specific Gravity, Urine 1.010 1.005 - 1.030   pH 6.0 5.0 - 8.0   Glucose, UA NEGATIVE NEGATIVE mg/dL   Hgb urine dipstick SMALL (A) NEGATIVE   Bilirubin Urine NEGATIVE NEGATIVE   Ketones, ur NEGATIVE NEGATIVE mg/dL   Protein, ur NEGATIVE NEGATIVE mg/dL   Nitrite NEGATIVE NEGATIVE   Leukocytes, UA NEGATIVE NEGATIVE  Urine microscopic-add on     Status: Abnormal   Collection Time: 09/24/15  3:30 PM  Result Value Ref Range   Squamous Epithelial / LPF 0-5 (A) NONE SEEN   WBC, UA NONE SEEN 0 - 5 WBC/hpf   RBC / HPF NONE SEEN 0 - 5  RBC/hpf   Bacteria, UA NONE SEEN NONE SEEN  Protein / creatinine ratio, urine     Status: None   Collection Time: 09/24/15  3:30 PM  Result Value Ref Range   Creatinine, Urine 128.00 mg/dL   Total Protein, Urine 7 mg/dL    Comment: NO NORMAL RANGE ESTABLISHED FOR THIS TEST   Protein Creatinine Ratio 0.05 0.00 - 0.15 mg/mg[Cre]  CBC with Differential     Status: Abnormal   Collection Time: 09/24/15  4:10 PM  Result Value Ref Range   WBC 7.4 4.0 - 10.5 K/uL   RBC 4.36 3.87 - 5.11 MIL/uL   Hemoglobin 12.7 12.0 - 15.0 g/dL   HCT 38.7 36.0 - 46.0 %   MCV 88.8 78.0 - 100.0 fL   MCH 29.1 26.0 - 34.0 pg   MCHC 32.8 30.0 - 36.0 g/dL   RDW 12.8 11.5 - 15.5 %   Platelets 291 150 - 400 K/uL   Neutrophils Relative % 87 %   Neutro Abs 6.4 1.7 - 7.7 K/uL   Lymphocytes Relative 6 %   Lymphs Abs 0.4 (L) 0.7 - 4.0 K/uL   Monocytes Relative 5 %  Monocytes Absolute 0.4 0.1 - 1.0 K/uL   Eosinophils Relative 2 %   Eosinophils Absolute 0.2 0.0 - 0.7 K/uL   Basophils Relative 0 %   Basophils Absolute 0.0 0.0 - 0.1 K/uL  Comprehensive metabolic panel     Status: Abnormal   Collection Time: 09/24/15  4:10 PM  Result Value Ref Range   Sodium 139 135 - 145 mmol/L   Potassium 3.9 3.5 - 5.1 mmol/L   Chloride 104 101 - 111 mmol/L   CO2 25 22 - 32 mmol/L   Glucose, Bld 88 65 - 99 mg/dL   BUN 8 6 - 20 mg/dL   Creatinine, Ser 0.83 0.44 - 1.00 mg/dL   Calcium 9.1 8.9 - 10.3 mg/dL   Total Protein 7.5 6.5 - 8.1 g/dL   Albumin 4.1 3.5 - 5.0 g/dL   AST 17 15 - 41 U/L   ALT 13 (L) 14 - 54 U/L   Alkaline Phosphatase 98 38 - 126 U/L   Total Bilirubin 0.9 0.3 - 1.2 mg/dL   GFR calc non Af Amer >60 >60 mL/min   GFR calc Af Amer >60 >60 mL/min    Comment: (NOTE) The eGFR has been calculated using the CKD EPI equation. This calculation has not been validated in all clinical situations. eGFR's persistently <60 mL/min signify possible Chronic Kidney Disease.    Anion gap 10 5 - 15  Vancomycin, trough      Status: Abnormal   Collection Time: 09/26/15  5:30 AM  Result Value Ref Range   Vancomycin Tr 5 (L) 10.0 - 20.0 ug/mL    Comment: Performed at [facility]: none IMAGING: No results found.   Assessment/Plan:  30yo F with lower abdominal wall cellulitis, superficial wound likely started from possible infected follicle/furuncle, draining. Still has low grade fever, but could be due to being subtherapeutic on vancomycin. Currently afebrile. She reports feeling improved  Abdominal wall cellulitis/superficial wound = it does not appear that the surgical site is involved but rather area near it. Recommend to change to cefazolin 2gm IV q 8hr for next 1-2d. To see that she continues to improve. Once afebrile x 24hr, can switch to keflex '500mg'$  QID. Would treat for a total of 10 days.   - will discontinue vancomycin  - if no improvement in the next 24-48hr, and still has significant pain, recommend to do ultrasound to see if any deep tissue abscess. Clinically it appears still superficial.  - continue with daily dressing change with abdominal pad to the affected area.  Breastfeeding = while on IV therapy, recommend to continue to pump and dump breastmilk. Once she switches to oral abtx, it has very low concentration transmitted with breastmilk, thus safe to give to newborn.  Hx of lightheadedness/headache = concern that it is related to her hx of hypertension and cardiomyopathy. She is being followed by cardiology. BP in normal range, though had sBP 89 recorded yesterday.   Thank you for consultation. If further questions, please call me at [place]. [place] for Infectious Diseases [phone]

## 2015-09-27 ENCOUNTER — Inpatient Hospital Stay (HOSPITAL_COMMUNITY): Payer: Medicaid Other

## 2015-09-27 ENCOUNTER — Other Ambulatory Visit: Payer: Self-pay | Admitting: Certified Nurse Midwife

## 2015-09-27 DIAGNOSIS — L98499 Non-pressure chronic ulcer of skin of other sites with unspecified severity: Secondary | ICD-10-CM

## 2015-09-27 MED ORDER — IOHEXOL 300 MG/ML  SOLN
100.0000 mL | Freq: Once | INTRAMUSCULAR | Status: AC | PRN
  Administered 2015-09-27: 100 mL via INTRAVENOUS

## 2015-09-27 NOTE — Progress Notes (Signed)
Area on right lower abdomen covered with honeycomb per Dr. Baxter Flattery.

## 2015-09-27 NOTE — Progress Notes (Signed)
[facility]    Date of Admission:  09/24/2015   Total days of antibiotics 4        Day 2 cefazolin            ID: Lydia Blair is a 30 y.o. female with  Lower abdominal wall cellulitis, superficial ulcer s/p c-section Active Problems:   Cellulitis   Hx of cardiomyopathy   Essential hypertension   Heart palpitations    Subjective: Afebrile, she states that she feels she has irritation at site, pain but has not taken any pain medication  Interval hx: had abd/pelvic CT that did not show any abscess needing drainage. Continues to pump and dump  Medications:  .  ceFAZolin (ANCEF) IV  2 g Intravenous 3 times per day  . docusate sodium  100 mg Oral BID  . prenatal multivitamin  1 tablet Oral Q1200  . zolpidem  5 mg Oral QHS    Objective: Vital signs in last 24 hours: Temp:  [98.1 F (36.7 C)-99.1 F (37.3 C)] 99.1 F (37.3 C) (01/05 1500) Pulse Rate:  [60-79] 71 (01/05 1500) Resp:  [18-20] 20 (01/05 1500) BP: (119-136)/(68-93) 124/83 mmHg (01/05 1500) SpO2:  [98 %-100 %] 100 % (01/05 1500)  Physical Exam  Constitutional:  oriented to person, place, and time. appears well-developed and well-nourished. No distress.  Skin: Shallow ulcer, 2 x 3cm, good pink granulation tissue. Mild surrounding erythema Psychiatric: a normal mood and affect.  behavior is normal.   Lab Results Lab Results  Component Value Date   WBC 7.4 09/24/2015   HGB 12.7 09/24/2015   HCT 38.7 09/24/2015   MCV 88.8 09/24/2015   PLT 291 09/24/2015    Microbiology: Wound cx = polymicrobial  Studies/Results: Ct Abdomen Pelvis W Contrast  09/27/2015  CLINICAL DATA:  Status post C-section. Fever or nausea and vomiting. Wound infection. EXAM: CT ABDOMEN AND PELVIS WITH CONTRAST TECHNIQUE: Multidetector CT imaging of the abdomen and pelvis was performed using the standard protocol following bolus administration of intravenous contrast. CONTRAST:  157mL OMNIPAQUE IOHEXOL 300  MG/ML  SOLN COMPARISON:  None. FINDINGS: Lower chest: There is no pleural or pericardial effusion. The lung bases are clear. Hepatobiliary: No focal liver abnormality. The gallbladder appears normal. No biliary dilatation. Pancreas: Negative Spleen: Normal. Adrenals/Urinary Tract: The adrenal glands are normal. Unremarkable appearance of the kidneys. The urinary bladder appears normal. Stomach/Bowel: The stomach is normal. The small bowel loops are unremarkable. No pathologic dilatation of the large bowel loops. Vascular/Lymphatic: Normal appearance of the abdominal aorta. No enlarged retroperitoneal or mesenteric adenopathy. No enlarged pelvic or inguinal lymph nodes. Reproductive: The uterus appears mildly edematous with fluid in the endometrial canal. C-section defect identified within the anterior myometrium, image 54 of series 603. No suspicious adnexal mass. Other: There is no free fluid identified within the abdomen or pelvis. Small umbilical hernia contains fat only. There is skin thickening and mild soft tissue stranding within the subcutaneous fat of the lower ventral abdominal wall and ventral pelvic wall in the area of concern as indicated by Fahrenheit fiducial marker. No fluid collection identified to suggest abscess, hematoma or seroma. Musculoskeletal: No aggressive lytic or sclerotic bone lesions identified. IMPRESSION: 1. No evidence for fluid collection within the lower abdominal wall in the area of concern. 2. Postsurgical appearance of the uterus compatible with recent C-section. 3. Skin thickening and mild subcutaneous fat stranding involving the abdominal wall below the level of the umbilicus may indicate cellulitis.  Electronically Signed   By: Kerby Moors M.D.   On: 09/27/2015 10:37     Assessment/Plan: Abdominal wall cellulitis, superficial ulcer/wound = appears to be improving. Reassuring that there is no abscess on CT imaging. recommend to continue with cefazolin today. Can  transition her to cephalexin 500mg  QID tomorrow am for discharge to finish out a total of 10d course of treatment including IV therapy.(thus 8 days of keflex). Not surprising that superficial wound cx is polymicrobial. She can continue to pump/dump while on abtx. Small amount of keflex does transmit to breastmilk, it is not thought to be in high enough concentration even for newborns. Ideally to pump and dump so that gut microbiome of infant is not exposed to abtx changes even at low doses.  Abdominal wound pain = recommend to treat with current pain meds to see if alleviate symptoms  Lydia Blair, [facility] Cell: [phone] Pager: [phone]  09/27/2015, 5:04 PM

## 2015-09-27 NOTE — Progress Notes (Signed)
HPI:  Lydia Blair is a 30 year old gravida 6 para 3024 who had a C-section at [facility] on December 19 due to prior hx of cardiomyopathy per MFM recommendations. She was admitted to [facility] on 09/25/15 because of cellulitis in her incision, along with elevated blood pressures and headache. At admission she was started on magnesium sulfate 4 g loading 2 g an hour and vancomycin d/t cellulitis of C-section incision. Magnesium stopped 09/26/15. Normotensive since.  Infectious disease saw patient 09/26/15, antibiotic changed from Vancomycin to Ancef.   Past medical history history of cardiomyopathy not on any medications, was evaluated in September 2016 with cardiology. Cardiology consult note completed 09/26/15, please see note.  Normal EKG 09/26/15.  Also, has a past hx of abscess and cellulitis at same site she is currently experiencing symptoms.   Past surgical history: C-section  Social history negative System review negative   Subjective: Patient reports incisional pain, tolerating PO and no problems voiding. Reports BM last night.  Reports soreness at wound infection site superior to C-section wound on right side about 3cmX3cm.  Reports pumping breast milk.     Objective: I have reviewed patient's vital signs, intake and output, medications and labs.  General: alert, cooperative and no distress Resp: clear to auscultation bilaterally Cardio: regular rate and rhythm, S1, S2 normal, no murmur, click, rub or gallop GI: soft, non-tender; bowel sounds normal; no masses, no organomegaly and incision: dry, erythematous and skin edema on right side towards umbilicus with warmth Extremities: extremities normal, atraumatic, no cyanosis or edema, Homans sign is negative, no sign of DVT and no edema, redness or tenderness in the calves or thighs Vaginal Bleeding: minimal Wound: superior to C-section about 1-2 inches above.  Minimal serous drainage. Appears red-deep pink throughout.  Roughly 3cmX3cm.  Wound edges flush and well defined.  Erythema and edema present around wound edges.  No evidence of tunneling.    Assessment/Plan: Cellulitis, Continue current care.

## 2015-09-28 ENCOUNTER — Other Ambulatory Visit: Payer: Self-pay | Admitting: Obstetrics

## 2015-09-28 DIAGNOSIS — L98491 Non-pressure chronic ulcer of skin of other sites limited to breakdown of skin: Secondary | ICD-10-CM

## 2015-09-28 MED ORDER — SILVER SULFADIAZINE 1 % EX CREA: 1.0000 "application " | TOPICAL_CREAM | Freq: Two times a day (BID) | CUTANEOUS | Status: DC

## 2015-09-28 MED ORDER — CEPHALEXIN 500 MG PO CAPS: 500.0000 mg | ORAL_CAPSULE | Freq: Four times a day (QID) | ORAL | Status: DC

## 2015-09-28 MED ORDER — TRAZODONE HCL 50 MG PO TABS
50.0000 mg | ORAL_TABLET | Freq: Every day | ORAL | Status: DC
  Administered 2015-09-28: 50 mg via ORAL
  Filled 2015-09-28 (×2): qty 1

## 2015-09-28 MED ORDER — DOCUSATE SODIUM 100 MG PO CAPS: 100.0000 mg | ORAL_CAPSULE | Freq: Two times a day (BID) | ORAL | Status: DC

## 2015-09-28 MED ORDER — POLYETHYLENE GLYCOL 3350 17 G PO PACK: 17.0000 g | PACK | Freq: Every day | ORAL | Status: DC | PRN

## 2015-09-28 MED ORDER — CEPHALEXIN 500 MG PO CAPS
500.0000 mg | ORAL_CAPSULE | Freq: Four times a day (QID) | ORAL | Status: DC
  Administered 2015-09-28 (×2): 500 mg via ORAL
  Filled 2015-09-28 (×6): qty 1

## 2015-09-28 MED ORDER — OXYCODONE HCL 5 MG PO TABS: 5.0000 mg | ORAL_TABLET | Freq: Four times a day (QID) | ORAL | Status: DC | PRN

## 2015-09-28 MED ORDER — LABETALOL HCL 100 MG PO TABS: 100.0000 mg | ORAL_TABLET | Freq: Two times a day (BID) | ORAL | Status: DC

## 2015-09-28 MED ORDER — LABETALOL HCL 100 MG PO TABS
100.0000 mg | ORAL_TABLET | Freq: Two times a day (BID) | ORAL | Status: DC
  Administered 2015-09-28: 100 mg via ORAL
  Filled 2015-09-28: qty 1

## 2015-09-28 MED ORDER — MAGNESIUM HYDROXIDE 400 MG/5ML PO SUSP: 30.0000 mL | Freq: Every day | ORAL | Status: DC | PRN

## 2015-09-28 NOTE — Progress Notes (Signed)
Pt is discharged in the care of friend.,with R,N.  Escort. Denies any pain or discomfort.  Discharged instructions with Rx were given to pt. . Questions were asked and answered. No equipment needed for home use. Stable.

## 2015-09-28 NOTE — Discharge Summary (Signed)
Physician Discharge Summary  Patient ID: Lydia Blair MRN: [id number] DOB/AGE: [birth year] 30 y.o.  Admit date: 09/24/2015 Discharge date: 09/28/2015  Admission Diagnoses: Cellulitis, essential HTN  Discharge Diagnoses: Cellulitis, essential HTN  Active Problems:   Cellulitis   Hx of cardiomyopathy   Essential hypertension   Heart palpitations   Discharged Condition: good  Hospital Course: uneventful  Consults: cardiology and ID  Significant Diagnostic Studies: labs: +MRSA wound culture  Treatments: IV hydration and antibiotics: vancomycin  Discharge Exam: Blood pressure 148/78, pulse 56, temperature 98.4 F (36.9 C), temperature source Oral, resp. rate 12, height 5\' 2"  (1.575 m), weight 167 lb (75.751 kg), SpO2 100 %, unknown if currently breastfeeding. General appearance: alert, cooperative and no distress Resp: clear to auscultation bilaterally Cardio: regular rate and rhythm, S1, S2 normal, no murmur, click, rub or gallop GI: soft, non-tender; bowel sounds normal; no masses,  no organomegaly Incision/Wound: stable, minimal serous drainage present on honeycomb dressing  Disposition: 01-Home or Self Care     Medication List    STOP taking these medications        diphenhydrAMINE 25 MG tablet  Commonly known as:  BENADRYL      TAKE these medications        albuterol 108 (90 Base) MCG/ACT inhaler  Commonly known as:  PROVENTIL HFA;VENTOLIN HFA  Inhale 2 puffs into the lungs every 6 (six) hours as needed for wheezing or shortness of breath.     cephALEXin 500 MG capsule  Commonly known as:  KEFLEX  Take 1 capsule (500 mg total) by mouth 4 (four) times daily.     docusate sodium 100 MG capsule  Commonly known as:  COLACE  Take 1 capsule (100 mg total) by mouth 2 (two) times daily.     magnesium hydroxide 400 MG/5ML suspension  Commonly known as:  MILK OF MAGNESIA  Take 30 mLs by mouth daily as needed for mild constipation.     Oxycodone HCl 10 MG Tabs   Take 10 mg by mouth every 6 (six) hours as needed (pain).     oxyCODONE 5 MG immediate release tablet  Commonly known as:  Oxy IR/ROXICODONE  Take 1 tablet (5 mg total) by mouth every 6 (six) hours as needed for severe pain.     polyethylene glycol packet  Commonly known as:  MIRALAX / GLYCOLAX  Take 17 g by mouth daily as needed for mild constipation or moderate constipation.     silver sulfADIAZINE 1 % cream  Commonly known as:  SILVADENE  Apply 1 application topically 2 (two) times daily.     VITAFOL ULTRA 29-0.6-0.4-200 MG Caps  Take 1 tablet by mouth daily.         Signed: Morene Crocker, CNM 09/28/2015, 3:14 PM

## 2015-09-28 NOTE — Progress Notes (Signed)
HPI:  Lydia Blair is a 30 year old gravida 6 para 3024 who had a C-section at [facility] on December 19 due to prior hx of cardiomyopathy per MFM recommendations. She was admitted to [facility] on 09/25/15 because of cellulitis in her incision, along with elevated blood pressures and headache. At admission she was started on magnesium sulfate 4 g loading 2 g an hour and vancomycin d/t cellulitis of C-section incision. Magnesium stopped 09/26/15. Normotensive since. Infectious disease saw patient 09/26/15, antibiotic changed from Vancomycin to Ancef. Ancef d/c 09/28/2015 and changed to PO Keflex in anticipation of d/c home today.   Past medical history history of cardiomyopathy not on any medications, was evaluated in September 2016 with cardiology. Cardiology consult note completed 09/26/15, please see note. Normal EKG 09/26/15. Also, has a past hx of abscess and cellulitis at same site she is currently experiencing symptoms.  Past surgical history: C-section  Social history negative System review negative   Subjective: Patient reports incisional pain, tolerating PO and no problems voiding. Constipation resolved. Reports tenderness at wound infection site superior to C-section wound on right side about 3cmX3cm. Reports pumping breast milk.    Objective: I have reviewed patient's vital signs, intake and output, medications and labs.  General: alert, cooperative and no distress Resp: clear to auscultation bilaterally Cardio: regular rate and rhythm, S1, S2 normal, no murmur, click, rub or gallop GI: soft, non-tender; bowel sounds normal; no masses, no organomegaly and incision: dry, erythematous and skin edema on right side towards umbilicus with warmth Extremities: extremities normal, atraumatic, no cyanosis or edema, Homans sign is negative, no sign of DVT and no edema, redness or tenderness in the calves or thighs Vaginal Bleeding: minimal Wound: superior to C-section about 1-2 inches  above. Minimal serous drainage present on honeycomb dressing. Non-tender today to palpation around dressing.   Assessment/Plan: Cellulitis, anticipate d/c home today if tolerates PO Keflex.

## 2015-09-29 LAB — WOUND CULTURE
GRAM STAIN: NONE SEEN
Special Requests: NORMAL

## 2015-09-30 ENCOUNTER — Other Ambulatory Visit: Payer: Self-pay | Admitting: Internal Medicine

## 2015-09-30 DIAGNOSIS — A4902 Methicillin resistant Staphylococcus aureus infection, unspecified site: Secondary | ICD-10-CM

## 2015-09-30 DIAGNOSIS — B9562 Methicillin resistant Staphylococcus aureus infection as the cause of diseases classified elsewhere: Secondary | ICD-10-CM

## 2015-09-30 MED ORDER — SULFAMETHOXAZOLE-TRIMETHOPRIM 800-160 MG PO TABS: 1.0000 | ORAL_TABLET | Freq: Two times a day (BID) | ORAL | Status: DC

## 2015-09-30 NOTE — Progress Notes (Signed)
Changing her antibiotics to bactrim due to culture results + MRSA. Left voice mail message

## 2015-10-02 ENCOUNTER — Ambulatory Visit: Payer: Medicaid Other | Admitting: Certified Nurse Midwife

## 2015-10-03 ENCOUNTER — Telehealth: Payer: Self-pay | Admitting: Certified Nurse Midwife

## 2015-10-03 ENCOUNTER — Telehealth: Payer: Self-pay | Admitting: Internal Medicine

## 2015-10-03 NOTE — Telephone Encounter (Signed)
Patient is taking bactrim plus keflex for her lower abd wall wound/cellulitis. She is noticing still drainage on pad she changes once a day. Recommended bid dressing, wet to dry, continue on abtx. Will call in 48hr to see if she is anybetter.

## 2015-10-05 NOTE — Telephone Encounter (Signed)
10/05/2015 - patient scheduled for 10/09/2015

## 2015-10-09 ENCOUNTER — Ambulatory Visit (INDEPENDENT_AMBULATORY_CARE_PROVIDER_SITE_OTHER): Payer: Medicaid Other | Admitting: Certified Nurse Midwife

## 2015-10-09 ENCOUNTER — Encounter: Payer: Self-pay | Admitting: Certified Nurse Midwife

## 2015-10-09 ENCOUNTER — Inpatient Hospital Stay: Payer: Medicaid Other | Admitting: Internal Medicine

## 2015-10-09 NOTE — Progress Notes (Signed)
Patient ID: Lydia Blair, female   DOB: November 28, 1985, 30 y.o.   MRN: [id number]  Subjective:     Lydia Blair is a 30 y.o. female who presents for a postpartum visit. She is 4 weeks postpartum following a low cervical transverse Cesarean section. I have fully reviewed the prenatal and intrapartum course. The delivery was at 67 gestational weeks. Outcome: repeat cesarean section, classical incision. Anesthesia: spinal. Postpartum course has been complicated by postpartum hypertension and cellulitis. Baby's course has been normal. Baby is feeding by bottle - Similac Advance. Bleeding no bleeding. Bowel function is normal. Bladder function is normal. Patient is not sexually active. Contraception method is tubal ligation. Postpartum depression screening: negative.  Tobacco, alcohol and substance abuse history reviewed.  Adult immunizations reviewed including TDAP, rubella and varicella.  The following portions of the patient's history were reviewed and updated as appropriate: allergies, current medications, past family history, past medical history, past social history, past surgical history and problem list.  Review of Systems Pertinent items noted in HPI and remainder of comprehensive ROS otherwise negative.   Objective:    BP 126/86 mmHg  Pulse 71  Temp(Src) 98.6 F (37 C)  Wt 169 lb (76.658 kg)  Breastfeeding? No  General:  alert, cooperative and no distress   Breasts:  inspection negative, no nipple discharge or bleeding, no masses or nodularity palpable  Lungs: clear to auscultation bilaterally  Heart:  regular rate and rhythm, S1, S2 normal, no murmur, click, rub or gallop  Abdomen: soft, non-tender; bowel sounds normal; no masses,  no organomegaly  Wound: healing, closed, nice epithelium, red where infection was about 3 mm round in size in two areas, non-tender, no drainage present   Vulva:  not evaluated  Vagina: not evaluated  Cervix:   not evaluated  Corpus: normal size,  contour, position, consistency, mobility, non-tender  Adnexa:  not evaluated  Rectal Exam: Not performed.          50% of 15 min visit spent on counseling and coordination of care.  Assessment:     Normal 4 week postpartum exam. Pap smear not done at today's visit.    Hx of recent cellulitis: healing.   Plan:    1. Contraception: tubal ligation 2. Follow up in: 3 weeks or as needed.  2hr GTT for h/o GDM/screening for DM q 3 yrs per ADA recommendations Preconception counseling provided Healthy lifestyle practices reviewed

## 2015-10-24 ENCOUNTER — Other Ambulatory Visit: Payer: Self-pay | Admitting: Certified Nurse Midwife

## 2015-10-24 ENCOUNTER — Telehealth: Payer: Self-pay | Admitting: *Deleted

## 2015-10-24 NOTE — Telephone Encounter (Signed)
Patient states she had a C-section/ BTL 12/19 and she is still bleeding. Patient states she has had moderate to light bleeding since delivery with maybe 2-3 days of no bleeding. She started last Wednesday with bleeding that has got increasingly heavier. Told patient that since she is at her 6 week mark- this could be her cycle trying to start. She should keep a watchful eye on the bleeding and if it continues heavy for 7-8 days she should call us back- otherwise continue her PNV and see what happens next month and if this bleeding tapers.

## 2015-10-30 ENCOUNTER — Ambulatory Visit: Payer: Medicaid Other | Admitting: Certified Nurse Midwife

## 2015-11-07 ENCOUNTER — Ambulatory Visit (INDEPENDENT_AMBULATORY_CARE_PROVIDER_SITE_OTHER): Payer: Self-pay | Admitting: Certified Nurse Midwife

## 2015-11-07 DIAGNOSIS — I10 Essential (primary) hypertension: Secondary | ICD-10-CM

## 2015-11-07 DIAGNOSIS — N939 Abnormal uterine and vaginal bleeding, unspecified: Secondary | ICD-10-CM

## 2015-11-07 LAB — CBC
HCT: 39 % (ref 36.0–46.0)
Hemoglobin: 12.5 g/dL (ref 12.0–15.0)
MCH: 28.3 pg (ref 26.0–34.0)
MCHC: 32.1 g/dL (ref 30.0–36.0)
MCV: 88.4 fL (ref 78.0–100.0)
MPV: 10.3 fL (ref 8.6–12.4)
Platelets: 317 10*3/uL (ref 150–400)
RBC: 4.41 MIL/uL (ref 3.87–5.11)
RDW: 14.3 % (ref 11.5–15.5)
WBC: 6.3 10*3/uL (ref 4.0–10.5)

## 2015-11-07 LAB — TSH: TSH: 0.8 m[IU]/L

## 2015-11-07 MED ORDER — IBUPROFEN 800 MG PO TABS: 800.0000 mg | ORAL_TABLET | Freq: Three times a day (TID) | ORAL | Status: DC | PRN

## 2015-11-08 ENCOUNTER — Encounter: Payer: Self-pay | Admitting: Certified Nurse Midwife

## 2015-11-08 LAB — PROTIME-INR
INR: 1.02 (ref <1.50)
PROTHROMBIN TIME: 13.5 s (ref 11.6–15.2)

## 2015-11-08 MED ORDER — ENALAPRIL-HYDROCHLOROTHIAZIDE 10-25 MG PO TABS: 1.0000 | ORAL_TABLET | Freq: Every day | ORAL | Status: DC

## 2015-11-08 NOTE — Progress Notes (Signed)
Patient ID: Lydia Blair, female   DOB: 07-Aug-1986, 30 y.o.   MRN: [id number]  Subjective:     Lydia Blair is a 30 y.o. female who presents for a postpartum visit. She is 6 weeks postpartum following a low cervical transverse Cesarean section. I have fully reviewed the prenatal and intrapartum course. The delivery was at 106 gestational weeks. Outcome: repeat cesarean section, low transverse incision. Anesthesia: spinal. Postpartum course has been complicated by a C-section wound infection with hospitalization and unstable blood pressures. Baby's course has been normal. Baby is feeding by bottle - Similac Isomil. Bleeding has had a period: was very heavy soaking a pad an hour with lots of cramping.  States she almost went to the hospital for treatment. Had BTL with C-Section at [facility] d/t hx of cardiomyopathy. Bowel function is normal. Bladder function is normal. Patient is not sexually active. Contraception method is abstinence. Postpartum depression screening: negative.  Tobacco, alcohol and substance abuse history reviewed.  Adult immunizations reviewed including TDAP, rubella and varicella.  The following portions of the patient's history were reviewed and updated as appropriate: allergies, current medications, past family history, past medical history, past social history, past surgical history and problem list.  Review of Systems Pertinent items noted in HPI and remainder of comprehensive ROS otherwise negative.   Objective:    BP 152/103 mmHg  Pulse 56  Wt 177 lb 6.4 oz (80.468 kg)  LMP 11/01/2015  General:  alert, cooperative and no distress   Breasts:  inspection negative, no nipple discharge or bleeding, no masses or nodularity palpable  Lungs: clear to auscultation bilaterally  Heart:  regular rate and rhythm, S1, S2 normal, no murmur, click, rub or gallop  Abdomen: soft, non-tender; bowel sounds normal; no masses,  no organomegaly   Vulva:  normal  Vagina: normal vagina   Cervix:  no bleeding following Pap  Corpus: normal size, contour, position, consistency, mobility, non-tender  Adnexa:  normal adnexa  Rectal Exam: Not performed.          50% of 40 min visit spent on counseling and coordination of care.  Assessment:     Normal 6 week postpartum exam. Pap smear not done at today's visit, not due yet.     Menorrhagia/dysmenorrhea   Elevated blood pressures postpartum: on labetalol.   Plan:    1. Contraception: tubal ligation 2. Ibuprofen 800mg  for period.  3. Follow up in: 4 weeks or as needed.  2hr GTT for h/o GDM/screening for DM q 3 yrs per ADA recommendations Preconception counseling provided Healthy lifestyle practices reviewed

## 2015-11-09 LAB — OTHER SOLSTAS TEST

## 2015-11-09 LAB — VON WILLEBRAND ANTIGEN: VON WILLEBRAND ANTIGEN, PLASMA: 101 % (ref 50–217)

## 2015-11-13 ENCOUNTER — Other Ambulatory Visit: Payer: Self-pay | Admitting: Certified Nurse Midwife

## 2015-11-13 LAB — SURESWAB, VAGINOSIS/VAGINITIS PLUS
Atopobium vaginae: NOT DETECTED Log (cells/mL)
C. albicans, DNA: NOT DETECTED
C. glabrata, DNA: NOT DETECTED
C. parapsilosis, DNA: NOT DETECTED
C. trachomatis RNA, TMA: NOT DETECTED
C. tropicalis, DNA: NOT DETECTED
GARDNERELLA VAGINALIS: 7.9 Log (cells/mL)
LACTOBACILLUS SPECIES: NOT DETECTED Log (cells/mL)
MEGASPHAERA SPECIES: NOT DETECTED Log (cells/mL)
N. GONORRHOEAE RNA, TMA: NOT DETECTED
T. vaginalis RNA, QL TMA: NOT DETECTED

## 2015-11-13 MED ORDER — METRONIDAZOLE 500 MG PO TABS: 500.0000 mg | ORAL_TABLET | Freq: Two times a day (BID) | ORAL | Status: DC

## 2015-11-16 ENCOUNTER — Encounter: Payer: Self-pay | Admitting: *Deleted

## 2015-12-05 ENCOUNTER — Ambulatory Visit: Payer: Medicaid Other | Admitting: Certified Nurse Midwife

## 2016-06-07 IMAGING — CT CT ABD-PELV W/ CM
1 of 2 series · 14 of 32 positions shown, 19 images · IV contrast (OMNIPAQUE)
Comparison: None.

CLINICAL DATA: Status post C-section. Fever or nausea and vomiting.
Wound infection.

EXAM:
CT ABDOMEN AND PELVIS WITH CONTRAST
TECHNIQUE: Multidetector CT imaging of the abdomen and pelvis was performed
using the standard protocol following bolus administration of
intravenous contrast.
CONTRAST:  100mL OMNIPAQUE IOHEXOL 300 MG/ML  SOLN

[Series 2: routine abdomen/pelvis with · axial · 0.78mm/px · z∈[+540,+905]mm · 14 of 83 slices shown, 19 images]
[im 5/83  soft-tissue]
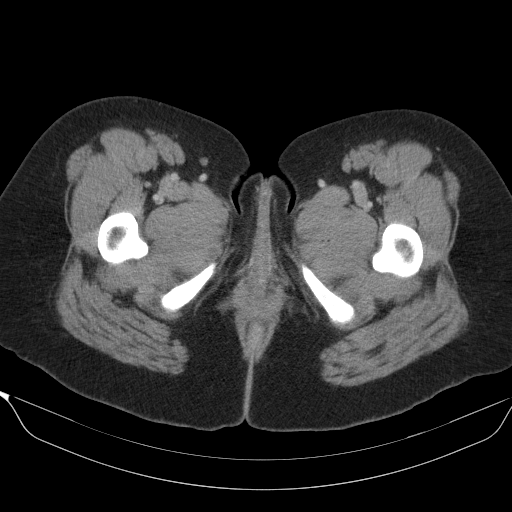
[im 5/83  bone]
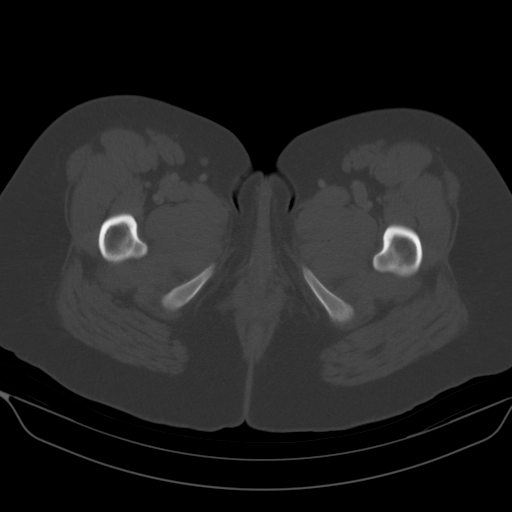
[im 10/83  soft-tissue]
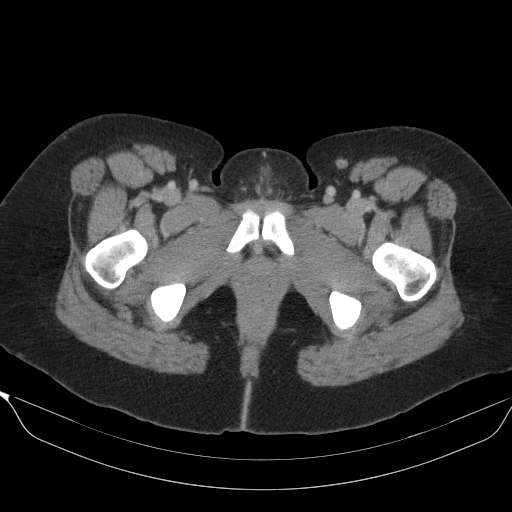
[im 19/83  soft-tissue]
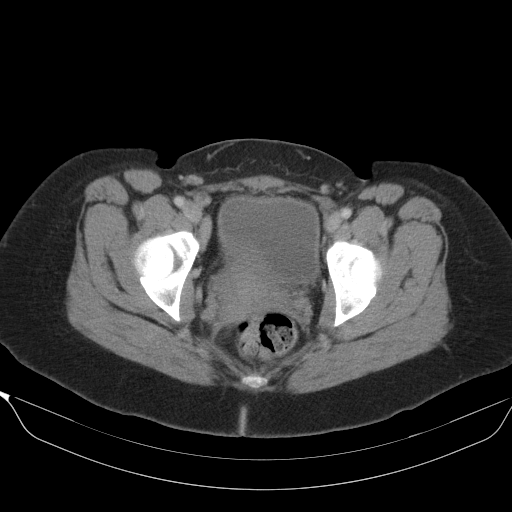
[im 23/83  soft-tissue]
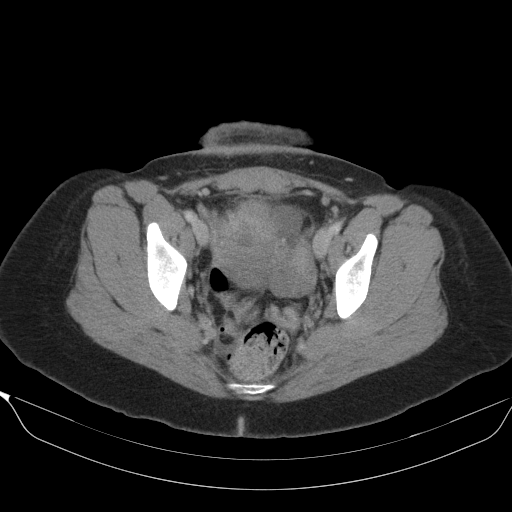
[im 28/83  soft-tissue]
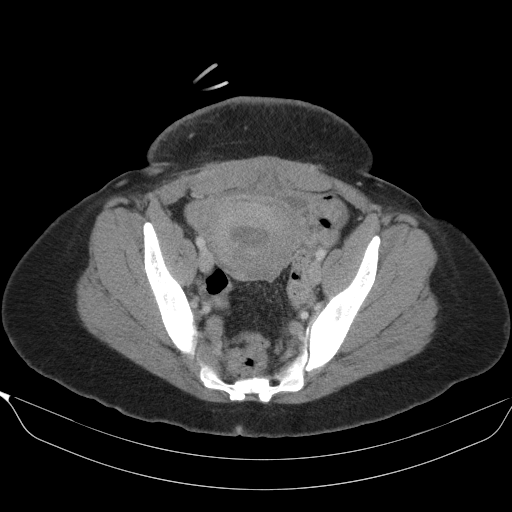
[im 37/83  soft-tissue]
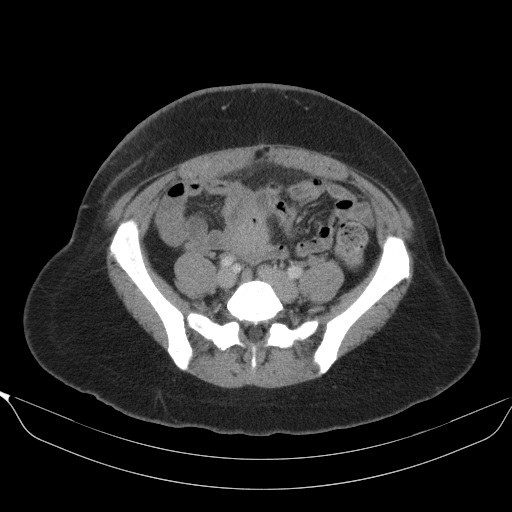
[im 42/83  soft-tissue]
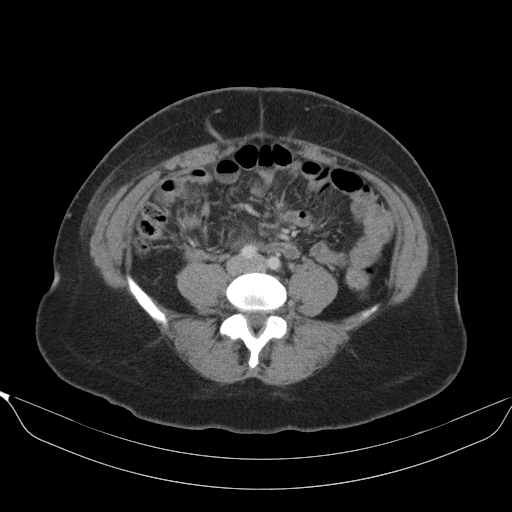
[im 46/83  soft-tissue]
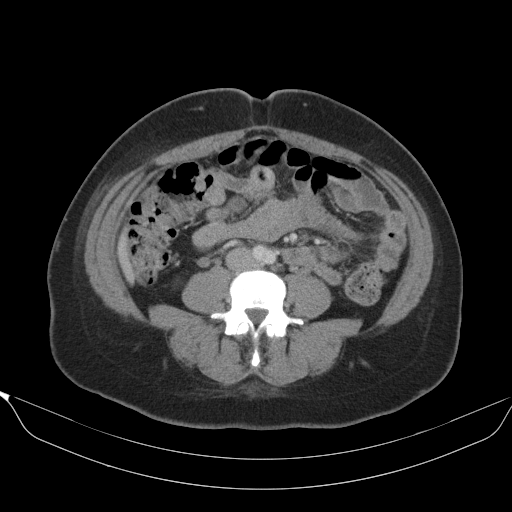
[im 55/83  soft-tissue]
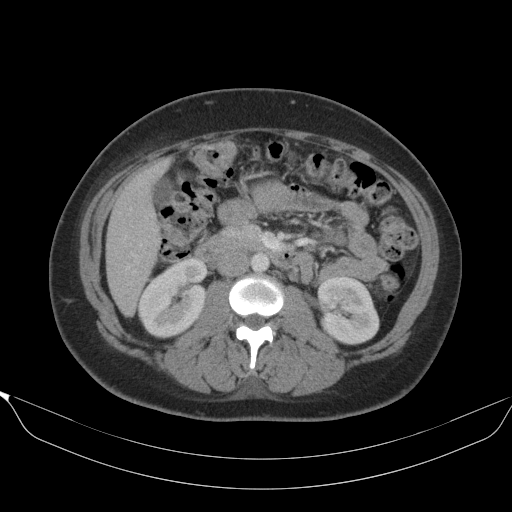
[im 55/83  bone]
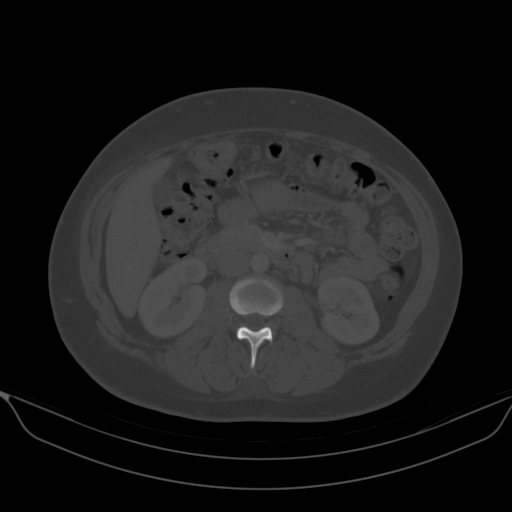
[im 60/83  soft-tissue]
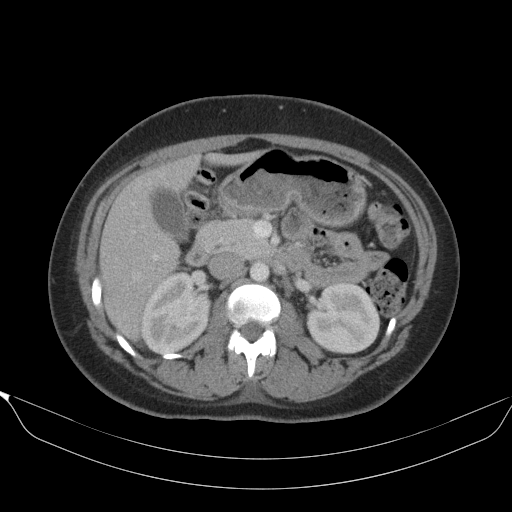
[im 64/83  soft-tissue]
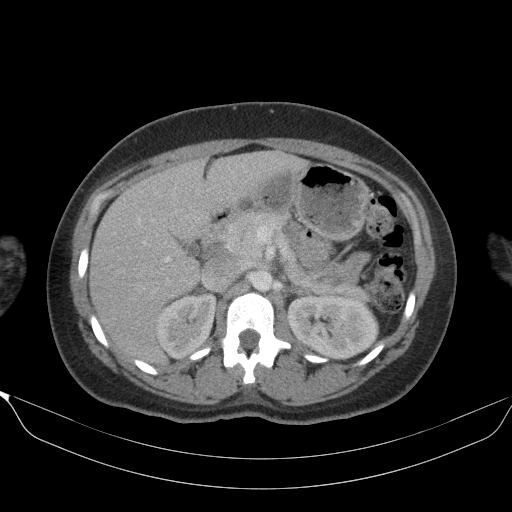
[im 64/83  lung]
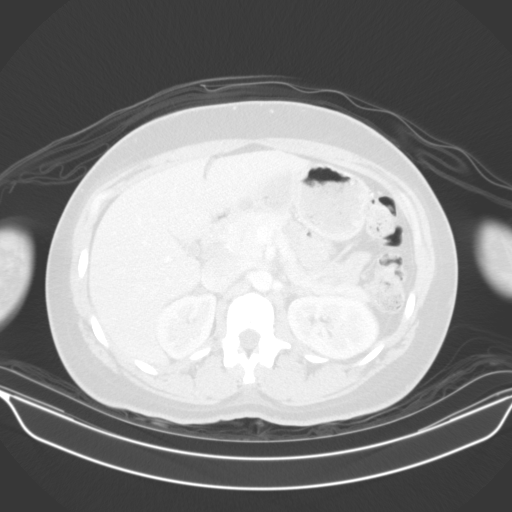
[im 69/83  lung]
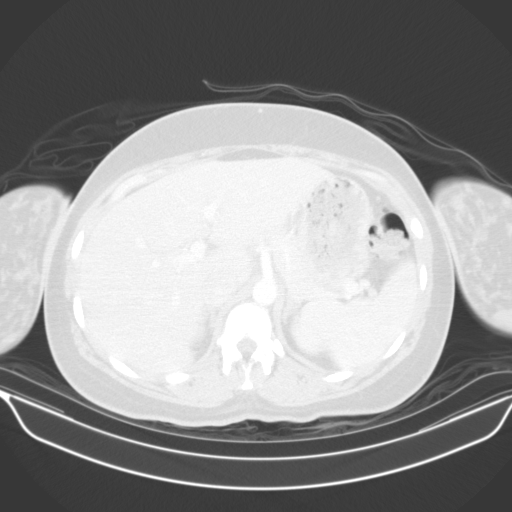
[im 73/83  soft-tissue]
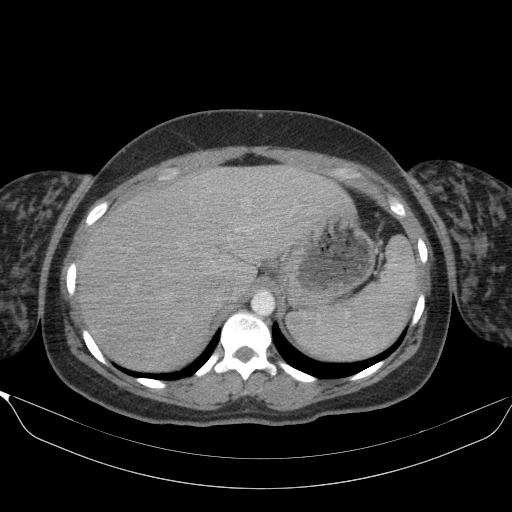
[im 73/83  lung]
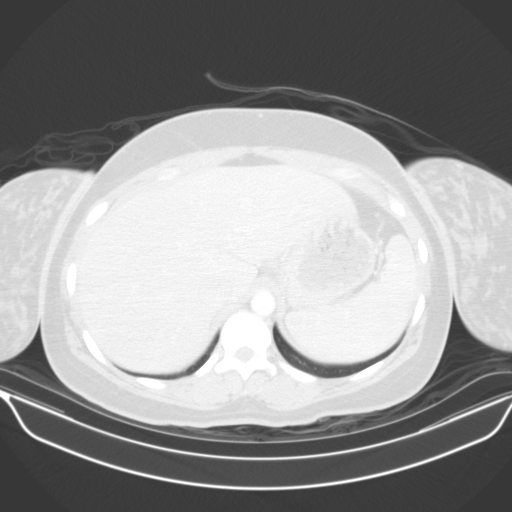
[im 78/83  soft-tissue]
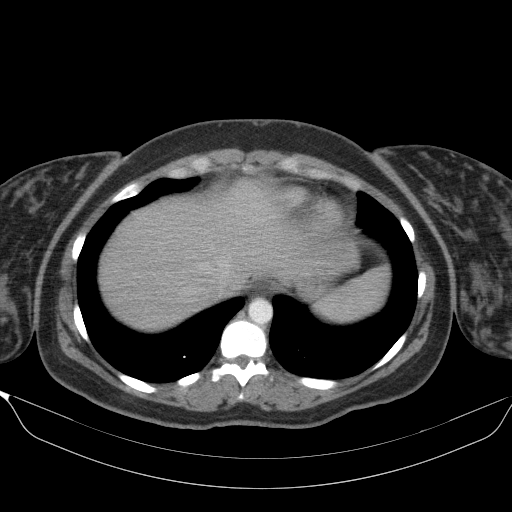
[im 78/83  lung]
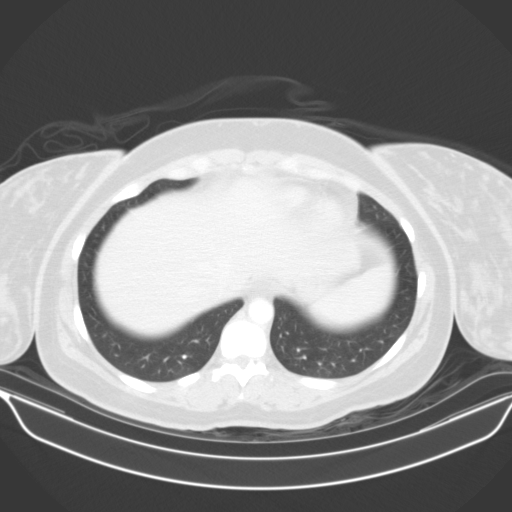

[14 of 32 positions shown; findings below may reference images not displayed]

FINDINGS: Lower chest: There is no pleural or pericardial effusion. The lung
bases are clear.

Hepatobiliary: No focal liver abnormality. The gallbladder appears
normal. No biliary dilatation.

Pancreas: Negative

Spleen: Normal.

Adrenals/Urinary Tract: The adrenal glands are normal. Unremarkable
appearance of the kidneys. The urinary bladder appears normal.

Stomach/Bowel: The stomach is normal. The small bowel loops are
unremarkable. No pathologic dilatation of the large bowel loops.

Vascular/Lymphatic: Normal appearance of the abdominal aorta. No
enlarged retroperitoneal or mesenteric adenopathy. No enlarged
pelvic or inguinal lymph nodes.

Reproductive: The uterus appears mildly edematous with fluid in the
endometrial canal. C-section defect identified within the anterior
myometrium, image 54 of series 603. No suspicious adnexal mass.

Other: There is no free fluid identified within the abdomen or
pelvis. Small umbilical hernia contains fat only. There is skin
thickening and mild soft tissue stranding within the subcutaneous
fat of the lower ventral abdominal wall and ventral pelvic wall in
the area of concern as indicated by Fahrenheit fiducial marker. No
fluid collection identified to suggest abscess, hematoma or seroma.

Musculoskeletal: No aggressive lytic or sclerotic bone lesions
identified.
IMPRESSION: 1. No evidence for fluid collection within the lower abdominal wall
in the area of concern.
2. Postsurgical appearance of the uterus compatible with recent
C-section.
3. Skin thickening and mild subcutaneous fat stranding involving the
abdominal wall below the level of the umbilicus may indicate
cellulitis.

## 2016-11-14 ENCOUNTER — Emergency Department (HOSPITAL_COMMUNITY): Payer: No Typology Code available for payment source

## 2016-11-14 ENCOUNTER — Encounter (HOSPITAL_COMMUNITY): Payer: Self-pay

## 2016-11-14 ENCOUNTER — Emergency Department (HOSPITAL_COMMUNITY)
Admission: EM | Admit: 2016-11-14 | Discharge: 2016-11-15 | Disposition: A | Discharge status: Home / Self Care | Payer: No Typology Code available for payment source | Attending: Emergency Medicine | Admitting: Emergency Medicine

## 2016-11-14 DIAGNOSIS — Y939 Activity, unspecified: Secondary | ICD-10-CM | POA: Insufficient documentation

## 2016-11-14 DIAGNOSIS — S8011XA Contusion of right lower leg, initial encounter: Secondary | ICD-10-CM | POA: Diagnosis not present

## 2016-11-14 DIAGNOSIS — M25512 Pain in left shoulder: Secondary | ICD-10-CM | POA: Insufficient documentation

## 2016-11-14 DIAGNOSIS — S40012A Contusion of left shoulder, initial encounter: Secondary | ICD-10-CM | POA: Diagnosis not present

## 2016-11-14 DIAGNOSIS — Z79899 Other long term (current) drug therapy: Secondary | ICD-10-CM | POA: Diagnosis not present

## 2016-11-14 DIAGNOSIS — S7011XA Contusion of right thigh, initial encounter: Secondary | ICD-10-CM

## 2016-11-14 DIAGNOSIS — S79921A Unspecified injury of right thigh, initial encounter: Secondary | ICD-10-CM | POA: Diagnosis present

## 2016-11-14 DIAGNOSIS — Y999 Unspecified external cause status: Secondary | ICD-10-CM | POA: Diagnosis not present

## 2016-11-14 DIAGNOSIS — Y92410 Unspecified street and highway as the place of occurrence of the external cause: Secondary | ICD-10-CM | POA: Diagnosis not present

## 2016-11-14 DIAGNOSIS — B9689 Other specified bacterial agents as the cause of diseases classified elsewhere: Secondary | ICD-10-CM

## 2016-11-14 DIAGNOSIS — Z87891 Personal history of nicotine dependence: Secondary | ICD-10-CM | POA: Diagnosis not present

## 2016-11-14 DIAGNOSIS — I10 Essential (primary) hypertension: Secondary | ICD-10-CM | POA: Insufficient documentation

## 2016-11-14 DIAGNOSIS — N898 Other specified noninflammatory disorders of vagina: Secondary | ICD-10-CM

## 2016-11-14 DIAGNOSIS — Z23 Encounter for immunization: Secondary | ICD-10-CM | POA: Insufficient documentation

## 2016-11-14 DIAGNOSIS — N760 Acute vaginitis: Secondary | ICD-10-CM | POA: Diagnosis not present

## 2016-11-14 LAB — WET PREP, GENITAL
Sperm: NONE SEEN
TRICH WET PREP: NONE SEEN
YEAST WET PREP: NONE SEEN

## 2016-11-14 LAB — URINALYSIS, ROUTINE W REFLEX MICROSCOPIC
BILIRUBIN URINE: NEGATIVE
Glucose, UA: NEGATIVE mg/dL
Hgb urine dipstick: NEGATIVE
KETONES UR: NEGATIVE mg/dL
LEUKOCYTES UA: NEGATIVE
Nitrite: NEGATIVE
PH: 6 (ref 5.0–8.0)
PROTEIN: NEGATIVE mg/dL
Specific Gravity, Urine: 1.008 (ref 1.005–1.030)
WBC UA: NONE SEEN WBC/hpf (ref 0–5)

## 2016-11-14 LAB — POC URINE PREG, ED: Preg Test, Ur: NEGATIVE

## 2016-11-14 MED ORDER — METRONIDAZOLE 0.75 % VA GEL: 1.0000 | Freq: Two times a day (BID) | VAGINAL | 0 refills | Status: DC

## 2016-11-14 NOTE — Discharge Instructions (Signed)
1. Medications: Metronidazole gel, usual home medications 2. Treatment: rest, drink plenty of fluids, use ice and heat for shoulder and right leg, use sling 3. Follow Up: Please followup with your primary doctor in 3 days and ortho in 1 week for discussion of your diagnoses and further evaluation after today's visit; if you do not have a primary care doctor use the resource guide provided to find one; Please return to the ER for worsening symptoms, difficulty walking, abdominal pain or other concerns.
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
## 2016-11-15 MED ORDER — TETANUS-DIPHTH-ACELL PERTUSSIS 5-2.5-18.5 LF-MCG/0.5 IM SUSP
0.5000 mL | Freq: Once | INTRAMUSCULAR | Status: AC
  Administered 2016-11-15: 0.5 mL via INTRAMUSCULAR
  Filled 2016-11-15: qty 0.5

## 2016-11-17 LAB — GC/CHLAMYDIA PROBE AMP (~~LOC~~) NOT AT ARMC
Chlamydia: NEGATIVE
Neisseria Gonorrhea: NEGATIVE

## 2017-01-22 ENCOUNTER — Emergency Department (HOSPITAL_COMMUNITY): Payer: Medicaid Other

## 2017-01-22 ENCOUNTER — Encounter (HOSPITAL_COMMUNITY): Payer: Self-pay

## 2017-01-22 ENCOUNTER — Emergency Department (HOSPITAL_COMMUNITY)
Admission: EM | Admit: 2017-01-22 | Discharge: 2017-01-22 | Disposition: A | Discharge status: Home / Self Care | Payer: Medicaid Other | Attending: Emergency Medicine | Admitting: Emergency Medicine

## 2017-01-22 DIAGNOSIS — M25462 Effusion, left knee: Secondary | ICD-10-CM | POA: Insufficient documentation

## 2017-01-22 DIAGNOSIS — Z87891 Personal history of nicotine dependence: Secondary | ICD-10-CM | POA: Insufficient documentation

## 2017-01-22 DIAGNOSIS — Z79899 Other long term (current) drug therapy: Secondary | ICD-10-CM | POA: Insufficient documentation

## 2017-01-22 DIAGNOSIS — I10 Essential (primary) hypertension: Secondary | ICD-10-CM | POA: Insufficient documentation

## 2017-01-22 DIAGNOSIS — M25562 Pain in left knee: Secondary | ICD-10-CM

## 2017-01-22 MED ORDER — NAPROXEN 500 MG PO TABS: 500.0000 mg | ORAL_TABLET | Freq: Two times a day (BID) | ORAL | 0 refills | Status: DC

## 2017-01-22 MED ORDER — NAPROXEN 250 MG PO TABS
500.0000 mg | ORAL_TABLET | Freq: Once | ORAL | Status: AC
  Administered 2017-01-22: 500 mg via ORAL
  Filled 2017-01-22: qty 2
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
## 2017-01-22 NOTE — Progress Notes (Signed)
Orthopedic Tech Progress Note Patient Details:  Lydia Blair 26-Dec-1985 [id number]  Ortho Devices Type of Ortho Device: Crutches, Knee Immobilizer Ortho Device/Splint Location: applied knee immobilizer and crutches to pt left leg/knee.  pt tolerated well.  Ortho Device/Splint Interventions: Application, Adjustment   [name] 01/22/2017, 3:02 PM

## 2017-01-22 NOTE — Discharge Instructions (Signed)
Knee immobilizer for your comfort. Crutches as needed. Call orthopedics for follow-up. Good chance is an internal injury to the left knee. Take the Naprosyn as directed. Elevate the leg is much as possible.

## 2017-01-22 NOTE — ED Notes (Signed)
Ortho tech paged due to no crutches in ED supply.
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
## 2017-01-22 NOTE — ED Notes (Signed)
Offered w/c-pt declined.
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
## 2017-02-02 ENCOUNTER — Encounter (HOSPITAL_COMMUNITY): Payer: Self-pay | Admitting: Emergency Medicine

## 2017-02-02 DIAGNOSIS — I10 Essential (primary) hypertension: Secondary | ICD-10-CM | POA: Insufficient documentation

## 2017-02-02 DIAGNOSIS — K529 Noninfective gastroenteritis and colitis, unspecified: Secondary | ICD-10-CM | POA: Insufficient documentation

## 2017-02-02 DIAGNOSIS — Z79899 Other long term (current) drug therapy: Secondary | ICD-10-CM | POA: Insufficient documentation

## 2017-02-02 DIAGNOSIS — Z87891 Personal history of nicotine dependence: Secondary | ICD-10-CM | POA: Insufficient documentation

## 2017-02-02 MED ORDER — ONDANSETRON 4 MG PO TBDP
ORAL_TABLET | ORAL | Status: AC
  Filled 2017-02-02: qty 1

## 2017-02-02 MED ORDER — ONDANSETRON 4 MG PO TBDP: 4.0000 mg | ORAL_TABLET | Freq: Once | ORAL | Status: DC | PRN

## 2017-02-02 NOTE — ED Triage Notes (Signed)
Pt presents to ED for assessment of 2 days of nausea, vomiting, diarrhea, and abdominal pain.  Pt states she ate at [employer] and has been having bouts of dizziness, sweating, and chills.

## 2017-02-03 ENCOUNTER — Emergency Department (HOSPITAL_COMMUNITY)
Admission: EM | Admit: 2017-02-03 | Discharge: 2017-02-03 | Disposition: A | Discharge status: Home / Self Care | Payer: Medicaid Other | Attending: Emergency Medicine | Admitting: Emergency Medicine

## 2017-02-03 DIAGNOSIS — K529 Noninfective gastroenteritis and colitis, unspecified: Secondary | ICD-10-CM

## 2017-02-03 LAB — URINALYSIS, ROUTINE W REFLEX MICROSCOPIC
Bacteria, UA: NONE SEEN
Bilirubin Urine: NEGATIVE
Glucose, UA: NEGATIVE mg/dL
Hgb urine dipstick: NEGATIVE
Ketones, ur: 80 mg/dL — AB
Leukocytes, UA: NEGATIVE
Nitrite: NEGATIVE
PH: 7 (ref 5.0–8.0)
PROTEIN: 100 mg/dL — AB
Specific Gravity, Urine: 1.026 (ref 1.005–1.030)

## 2017-02-03 LAB — COMPREHENSIVE METABOLIC PANEL
ALBUMIN: 4.8 g/dL (ref 3.5–5.0)
ALT: 17 U/L (ref 14–54)
AST: 26 U/L (ref 15–41)
Alkaline Phosphatase: 70 U/L (ref 38–126)
Anion gap: 13 (ref 5–15)
BUN: 10 mg/dL (ref 6–20)
CHLORIDE: 102 mmol/L (ref 101–111)
CO2: 23 mmol/L (ref 22–32)
CREATININE: 0.74 mg/dL (ref 0.44–1.00)
Calcium: 9.9 mg/dL (ref 8.9–10.3)
GFR calc non Af Amer: >60 mL/min (ref >60)
Glucose, Bld: 123 mg/dL — ABNORMAL HIGH (ref 65–99)
Potassium: 3.8 mmol/L (ref 3.5–5.1)
SODIUM: 138 mmol/L (ref 135–145)
Total Bilirubin: 0.8 mg/dL (ref 0.3–1.2)
Total Protein: 9.2 g/dL — ABNORMAL HIGH (ref 6.5–8.1)

## 2017-02-03 LAB — LIPASE, BLOOD: LIPASE: 22 U/L (ref 11–51)

## 2017-02-03 LAB — CBC
HCT: 45.4 % (ref 36.0–46.0)
Hemoglobin: 15.6 g/dL — ABNORMAL HIGH (ref 12.0–15.0)
MCH: 30.6 pg (ref 26.0–34.0)
MCHC: 34.4 g/dL (ref 30.0–36.0)
MCV: 89.2 fL (ref 78.0–100.0)
PLATELETS: 305 10*3/uL (ref 150–400)
RBC: 5.09 MIL/uL (ref 3.87–5.11)
RDW: 12.7 % (ref 11.5–15.5)
WBC: 8.6 10*3/uL (ref 4.0–10.5)

## 2017-02-03 LAB — PREGNANCY, URINE: PREG TEST UR: NEGATIVE

## 2017-02-03 MED ORDER — DIPHENHYDRAMINE HCL 50 MG/ML IJ SOLN
12.5000 mg | Freq: Once | INTRAMUSCULAR | Status: AC
  Administered 2017-02-03: 12.5 mg via INTRAVENOUS
  Filled 2017-02-03: qty 1

## 2017-02-03 MED ORDER — DICYCLOMINE HCL 20 MG PO TABS: 20.0000 mg | ORAL_TABLET | Freq: Two times a day (BID) | ORAL | 0 refills | Status: DC

## 2017-02-03 MED ORDER — PROMETHAZINE HCL 25 MG/ML IJ SOLN
12.5000 mg | Freq: Once | INTRAMUSCULAR | Status: AC
  Administered 2017-02-03: 12.5 mg via INTRAVENOUS
  Filled 2017-02-03: qty 1

## 2017-02-03 MED ORDER — SODIUM CHLORIDE 0.9 % IV BOLUS (SEPSIS): 1000.0000 mL | Freq: Once | INTRAVENOUS | Status: DC

## 2017-02-03 MED ORDER — PROMETHAZINE HCL 25 MG PO TABS: 25.0000 mg | ORAL_TABLET | Freq: Four times a day (QID) | ORAL | 0 refills | Status: DC | PRN

## 2017-02-03 MED ORDER — ONDANSETRON HCL 4 MG/2ML IJ SOLN
4.0000 mg | Freq: Once | INTRAMUSCULAR | Status: AC
  Administered 2017-02-03: 4 mg via INTRAVENOUS
  Filled 2017-02-03: qty 2

## 2017-02-03 MED ORDER — METOCLOPRAMIDE HCL 5 MG/ML IJ SOLN
10.0000 mg | Freq: Once | INTRAMUSCULAR | Status: AC
  Administered 2017-02-03: 10 mg via INTRAVENOUS
  Filled 2017-02-03: qty 2

## 2017-02-03 MED ORDER — SODIUM CHLORIDE 0.9 % IV BOLUS (SEPSIS)
1000.0000 mL | Freq: Once | INTRAVENOUS | Status: AC
  Administered 2017-02-03: 1000 mL via INTRAVENOUS

## 2017-02-03 NOTE — ED Provider Notes (Signed)
[facility] Provider Note   CSN: [id number] Arrival date & time: 02/02/17  2255     History   Chief Complaint Chief Complaint  Patient presents with  . Abdominal Pain  . Nausea    HPI Lydia Blair is a 31 y.o. female.  Patient presents with nausea, vomiting, diarrhea and upper abdominal pain x 2 days. She feels her symptoms started after having a meal at a restaurant. No fever. No lower abdominal pain, urinary symptoms, back or chest pain, SOB or cough. No one that ate with her the same night is sick. No hematemesis or blood stools.    The history is provided by the patient. No language interpreter was used.    Past Medical History:  Diagnosis Date  . Cardiomyopathy ([place]) 2014  . Dermoid cyst of left orbit   . Prehypertension     Patient Active Problem List   Diagnosis Date Noted  . Hx of cardiomyopathy 09/26/2015  . Essential hypertension   . Heart palpitations   . Cellulitis 09/24/2015  . Cardiomyopathy ([place]) 05/02/2015  . Palpitation 05/02/2015  . SOB (shortness of breath) 05/02/2015  . BV (bacterial vaginosis) 02/08/2014  . Vaginitis and vulvovaginitis, unspecified 06/02/2013    Past Surgical History:  Procedure Laterality Date  . CESAREAN SECTION    . INCISE AND DRAIN ABCESS    . WISDOM TOOTH EXTRACTION      OB History    Gravida Para Term Preterm AB Living   6 4 3   2 4    SAB TAB Ectopic Multiple Live Births     2     3       Home Medications    Prior to Admission medications   Medication Sig Start Date End Date Taking? Authorizing Provider  albuterol (PROVENTIL HFA;VENTOLIN HFA) 108 (90 BASE) MCG/ACT inhaler Inhale 2 puffs into the lungs every 6 (six) hours as needed for wheezing or shortness of breath. Patient not taking: Reported on 10/09/2015 05/16/15   Kandis Cocking A, CNM  enalapril-hydrochlorothiazide (VASERETIC) 10-25 MG tablet Take 1 tablet by mouth daily. Patient not taking: Reported on 11/14/2016 11/08/15   Kandis Cocking A, CNM  labetalol (NORMODYNE) 100 MG tablet Take 1 tablet (100 mg total) by mouth 2 (two) times daily. Patient not taking: Reported on 11/14/2016 09/28/15   Kandis Cocking A, CNM  metroNIDAZOLE (METROGEL VAGINAL) 0.75 % vaginal gel Place 1 Applicatorful vaginally 2 (two) times daily. 11/14/16   Muthersbaugh, Jarrett Soho, PA-C  naproxen (NAPROSYN) 500 MG tablet Take 1 tablet (500 mg total) by mouth 2 (two) times daily. 01/22/17   Fredia Sorrow, MD  Prenat-Fe Poly-Methfol-FA-DHA (VITAFOL ULTRA) 29-0.6-0.4-200 MG CAPS Take 1 tablet by mouth daily. Patient not taking: Reported on 10/09/2015 07/12/15   Kandis Cocking A, CNM  silver sulfADIAZINE (SILVADENE) 1 % cream Apply 1 application topically 2 (two) times daily. Patient not taking: Reported on 10/09/2015 09/28/15   Shelly Bombard, MD    Family History Family History  Problem Relation Age of Onset  . Diabetes Maternal Grandmother   . Hypertension Maternal Grandmother   . Hypertension Maternal Grandfather   . Hypertension Paternal Grandfather   . HIV Father   . Hypertension Sister     Social History Social History  Substance Use Topics  . Smoking status: Former Smoker    Quit date: 01/29/2015  . Smokeless tobacco: Never Used  . Alcohol use No     Allergies   Vicodin [hydrocodone-acetaminophen]   Review of Systems  Review of Systems  Constitutional: Negative for chills and fever.  Respiratory: Negative.  Negative for shortness of breath.   Cardiovascular: Negative.  Negative for chest pain.  Gastrointestinal: Positive for abdominal pain, diarrhea, nausea and vomiting. Negative for blood in stool.  Genitourinary: Negative.  Negative for dysuria and vaginal discharge.  Musculoskeletal: Negative.  Negative for back pain and myalgias.  Skin: Negative.   Neurological: Negative.  Negative for syncope.     Physical Exam Updated Vital Signs BP (!) 153/93 (BP Location: Left Arm)   Pulse (!) 116   Temp 98.9 F (37.2 C) (Oral)    Resp (!) 28   LMP 01/20/2017   SpO2 100%   Physical Exam  Constitutional: She is oriented to person, place, and time. She appears well-developed and well-nourished.  Patient appears significantly uncomfortable.   HENT:  Mouth/Throat: Oropharynx is clear and moist.  Neck: Normal range of motion.  Cardiovascular: Normal rate and regular rhythm.   No murmur heard. Pulmonary/Chest: Effort normal. She has no wheezes. She has no rales. She exhibits no tenderness.  Abdominal: Soft. She exhibits no mass. There is tenderness (Across upper abdomen).  Musculoskeletal: Normal range of motion.  Neurological: She is alert and oriented to person, place, and time.  Skin: Skin is warm and dry.     ED Treatments / Results  Labs (all labs ordered are listed, but only abnormal results are displayed) Labs Reviewed  COMPREHENSIVE METABOLIC PANEL - Abnormal; Notable for the following:       Result Value   Glucose, Bld 123 (*)    Total Protein 9.2 (*)    All other components within normal limits  CBC - Abnormal; Notable for the following:    Hemoglobin 15.6 (*)    All other components within normal limits  URINALYSIS, ROUTINE W REFLEX MICROSCOPIC - Abnormal; Notable for the following:    Ketones, ur 80 (*)    Protein, ur 100 (*)    Squamous Epithelial / LPF 0-5 (*)    All other components within normal limits  LIPASE, BLOOD  PREGNANCY, URINE  I-STAT BETA HCG BLOOD, ED ([facility], [facility], [facility] ONLY)    EKG  EKG Interpretation None       Radiology No results found.  Procedures Procedures (including critical care time)  Medications Ordered in ED Medications  ondansetron (ZOFRAN-ODT) disintegrating tablet 4 mg (not administered)  ondansetron (ZOFRAN-ODT) 4 MG disintegrating tablet (not administered)  sodium chloride 0.9 % bolus 1,000 mL (not administered)  ondansetron (ZOFRAN) injection 4 mg (not administered)  metoCLOPramide (REGLAN) injection 10 mg (not administered)  diphenhydrAMINE  (BENADRYL) injection 12.5 mg (not administered)     Initial Impression / Assessment and Plan / ED Course  I have reviewed the triage vital signs and the nursing notes.  Pertinent labs & imaging results that were available during my care of the patient were reviewed by me and considered in my medical decision making (see chart for details).     Patient with N, V, D and upper abdominal pain since eating out at a restaurant 2 days ago. No fever. No bloody stool or hematemesis.   Symptoms controlled in the ED. IVF's provided with antiemetic medications. Labs unremarkable, not pregnant.   She can be discharged home with outpatient f/u if symptoms persist.   Final Clinical Impressions(s) / ED Diagnoses   Final diagnoses:  None   1. Nausea and vomiting 2. Diarrhea 3. Abdominal pain  New Prescriptions New Prescriptions   No medications on  file     Charlann Lange, PA-C 02/03/17 3267    Ripley Fraise, MD 02/04/17 [phone]

## 2017-02-05 ENCOUNTER — Encounter (HOSPITAL_COMMUNITY): Payer: Self-pay | Admitting: Emergency Medicine

## 2017-02-05 ENCOUNTER — Emergency Department (HOSPITAL_COMMUNITY)
Admission: EM | Admit: 2017-02-05 | Discharge: 2017-02-05 | Disposition: A | Discharge status: Home / Self Care | Payer: Medicaid Other | Attending: Emergency Medicine | Admitting: Emergency Medicine

## 2017-02-05 DIAGNOSIS — R112 Nausea with vomiting, unspecified: Secondary | ICD-10-CM | POA: Insufficient documentation

## 2017-02-05 DIAGNOSIS — I10 Essential (primary) hypertension: Secondary | ICD-10-CM | POA: Insufficient documentation

## 2017-02-05 DIAGNOSIS — R1013 Epigastric pain: Secondary | ICD-10-CM

## 2017-02-05 DIAGNOSIS — Z87891 Personal history of nicotine dependence: Secondary | ICD-10-CM | POA: Insufficient documentation

## 2017-02-05 DIAGNOSIS — Z79899 Other long term (current) drug therapy: Secondary | ICD-10-CM | POA: Insufficient documentation

## 2017-02-05 LAB — CBC
HCT: 44.8 % (ref 36.0–46.0)
Hemoglobin: 15.9 g/dL — ABNORMAL HIGH (ref 12.0–15.0)
MCH: 31.1 pg (ref 26.0–34.0)
MCHC: 35.5 g/dL (ref 30.0–36.0)
MCV: 87.7 fL (ref 78.0–100.0)
Platelets: 338 10*3/uL (ref 150–400)
RBC: 5.11 MIL/uL (ref 3.87–5.11)
RDW: 12 % (ref 11.5–15.5)
WBC: 10 10*3/uL (ref 4.0–10.5)

## 2017-02-05 LAB — COMPREHENSIVE METABOLIC PANEL
ALT: 16 U/L (ref 14–54)
AST: 21 U/L (ref 15–41)
Albumin: 4.8 g/dL (ref 3.5–5.0)
Alkaline Phosphatase: 70 U/L (ref 38–126)
Anion gap: 11 (ref 5–15)
BUN: 10 mg/dL (ref 6–20)
CO2: 25 mmol/L (ref 22–32)
Calcium: 9.8 mg/dL (ref 8.9–10.3)
Chloride: 98 mmol/L — ABNORMAL LOW (ref 101–111)
Creatinine, Ser: 0.82 mg/dL (ref 0.44–1.00)
GFR calc Af Amer: >60 mL/min (ref >60)
GFR calc non Af Amer: >60 mL/min (ref >60)
Glucose, Bld: 118 mg/dL — ABNORMAL HIGH (ref 65–99)
Potassium: 3.6 mmol/L (ref 3.5–5.1)
Sodium: 134 mmol/L — ABNORMAL LOW (ref 135–145)
Total Bilirubin: 0.9 mg/dL (ref 0.3–1.2)
Total Protein: 8.5 g/dL — ABNORMAL HIGH (ref 6.5–8.1)

## 2017-02-05 LAB — URINALYSIS, ROUTINE W REFLEX MICROSCOPIC
Bacteria, UA: NONE SEEN
Bilirubin Urine: NEGATIVE
Glucose, UA: NEGATIVE mg/dL
Ketones, ur: 5 mg/dL — AB
Leukocytes, UA: NEGATIVE
Nitrite: NEGATIVE
Protein, ur: NEGATIVE mg/dL
Specific Gravity, Urine: 1.003 — ABNORMAL LOW (ref 1.005–1.030)
pH: 7 (ref 5.0–8.0)

## 2017-02-05 LAB — LIPASE, BLOOD: Lipase: 28 U/L (ref 11–51)

## 2017-02-05 MED ORDER — OMEPRAZOLE 20 MG PO CPDR: 20.0000 mg | DELAYED_RELEASE_CAPSULE | Freq: Two times a day (BID) | ORAL | 0 refills | Status: DC

## 2017-02-05 MED ORDER — FAMOTIDINE IN NACL 20-0.9 MG/50ML-% IV SOLN
20.0000 mg | Freq: Once | INTRAVENOUS | Status: AC
  Administered 2017-02-05: 20 mg via INTRAVENOUS
  Filled 2017-02-05: qty 50

## 2017-02-05 MED ORDER — ONDANSETRON HCL 4 MG/2ML IJ SOLN
4.0000 mg | Freq: Once | INTRAMUSCULAR | Status: AC
  Administered 2017-02-05: 4 mg via INTRAVENOUS
  Filled 2017-02-05: qty 2

## 2017-02-05 MED ORDER — PANTOPRAZOLE SODIUM 40 MG IV SOLR
40.0000 mg | Freq: Once | INTRAVENOUS | Status: AC
  Administered 2017-02-05: 40 mg via INTRAVENOUS
  Filled 2017-02-05: qty 40

## 2017-02-05 MED ORDER — GI COCKTAIL ~~LOC~~
30.0000 mL | Freq: Once | ORAL | Status: AC
  Administered 2017-02-05: 30 mL via ORAL
  Filled 2017-02-05: qty 30

## 2017-02-05 MED ORDER — SODIUM CHLORIDE 0.9 % IV BOLUS (SEPSIS)
1000.0000 mL | Freq: Once | INTRAVENOUS | Status: AC
  Administered 2017-02-05: 1000 mL via INTRAVENOUS

## 2017-02-05 NOTE — ED Notes (Signed)
Patient continues to complain of nausea and upset stomach.  Family requesting scans to be done.  MD notified.

## 2017-02-05 NOTE — ED Provider Notes (Signed)
[facility] DEPT Provider Note   CSN: [id number] Arrival date & time: 02/05/17  1328     History   Chief Complaint Chief Complaint  Patient presents with  . Nausea  . Emesis  . Abdominal Pain    HPI SIMMIE GARIN is a 31 y.o. female.  HPI   31 year old female with nausea/vomiting and upper abdominal pain. Symptom onset about 5 days ago. She was seen in the emergency room on 5/15 for the same complaint. She is prescribed Phenergan which she has been taking without much improvement of her nausea. She states that this makes her sleepy. Having constant pain in her upper abdomen. Does not radiate. She reports that initially her emesis was yellow in color but in the last day has become brown/black. She denies any significant NSAID usage. Denies significant alcohol usage. Denies hx of GERD or PUD.   Past Medical History:  Diagnosis Date  . Cardiomyopathy ([place]) 2014  . Dermoid cyst of left orbit   . Prehypertension     Patient Active Problem List   Diagnosis Date Noted  . Hx of cardiomyopathy 09/26/2015  . Essential hypertension   . Heart palpitations   . Cellulitis 09/24/2015  . Cardiomyopathy ([place]) 05/02/2015  . Palpitation 05/02/2015  . SOB (shortness of breath) 05/02/2015  . BV (bacterial vaginosis) 02/08/2014  . Vaginitis and vulvovaginitis, unspecified 06/02/2013    Past Surgical History:  Procedure Laterality Date  . CESAREAN SECTION    . INCISE AND DRAIN ABCESS    . WISDOM TOOTH EXTRACTION      OB History    Gravida Para Term Preterm AB Living   6 4 3   2 4    SAB TAB Ectopic Multiple Live Births     2     3       Home Medications    Prior to Admission medications   Medication Sig Start Date End Date Taking? Authorizing Provider  dicyclomine (BENTYL) 20 MG tablet Take 1 tablet (20 mg total) by mouth 2 (two) times daily. 02/03/17  Yes Upstill, Shari, PA-C  naproxen (NAPROSYN) 500 MG tablet Take 1 tablet (500 mg total) by mouth 2 (two) times daily.  01/22/17  Yes Fredia Sorrow, MD  promethazine (PHENERGAN) 25 MG tablet Take 1 tablet (25 mg total) by mouth every 6 (six) hours as needed for nausea or vomiting. 02/03/17  Yes Upstill, Shari, PA-C  albuterol (PROVENTIL HFA;VENTOLIN HFA) 108 (90 BASE) MCG/ACT inhaler Inhale 2 puffs into the lungs every 6 (six) hours as needed for wheezing or shortness of breath. Patient not taking: Reported on 10/09/2015 05/16/15   Kandis Cocking A, CNM  enalapril-hydrochlorothiazide (VASERETIC) 10-25 MG tablet Take 1 tablet by mouth daily. Patient not taking: Reported on 11/14/2016 11/08/15   Kandis Cocking A, CNM  labetalol (NORMODYNE) 100 MG tablet Take 1 tablet (100 mg total) by mouth 2 (two) times daily. Patient not taking: Reported on 11/14/2016 09/28/15   Kandis Cocking A, CNM  metroNIDAZOLE (METROGEL VAGINAL) 0.75 % vaginal gel Place 1 Applicatorful vaginally 2 (two) times daily. Patient not taking: Reported on 02/05/2017 11/14/16   Muthersbaugh, Jarrett Soho, PA-C  Prenat-Fe Poly-Methfol-FA-DHA (VITAFOL ULTRA) 29-0.6-0.4-200 MG CAPS Take 1 tablet by mouth daily. Patient not taking: Reported on 10/09/2015 07/12/15   Kandis Cocking A, CNM  silver sulfADIAZINE (SILVADENE) 1 % cream Apply 1 application topically 2 (two) times daily. Patient not taking: Reported on 10/09/2015 09/28/15   Shelly Bombard, MD    Family History Family History  Problem Relation Age of Onset  . Diabetes Maternal Grandmother   . Hypertension Maternal Grandmother   . Hypertension Maternal Grandfather   . Hypertension Paternal Grandfather   . HIV Father   . Hypertension Sister     Social History Social History  Substance Use Topics  . Smoking status: Former Smoker    Quit date: 01/29/2015  . Smokeless tobacco: Never Used  . Alcohol use No     Allergies   Vicodin [hydrocodone-acetaminophen]   Review of Systems Review of Systems  All systems reviewed and negative, other than as noted in HPI.  Physical Exam Updated Vital  Signs BP (!) 155/118 (BP Location: Left Arm) Comment: pt states "i have hypertension"  Pulse 89   Temp 98.3 F (36.8 C) (Oral)   Resp (!) 22   Ht 5\' 2"  (1.575 m)   Wt 145 lb 4.8 oz (65.9 kg)   LMP 01/20/2017   SpO2 100%   BMI 26.58 kg/m   Physical Exam  Constitutional: She appears well-developed and well-nourished. No distress.  HENT:  Head: Normocephalic and atraumatic.  Eyes: Conjunctivae are normal. Right eye exhibits no discharge. Left eye exhibits no discharge.  Neck: Neck supple.  Cardiovascular: Normal rate, regular rhythm and normal heart sounds.  Exam reveals no gallop and no friction rub.   No murmur heard. Pulmonary/Chest: Effort normal and breath sounds normal. No respiratory distress.  Abdominal: Soft. She exhibits no distension. There is no tenderness.  Musculoskeletal: She exhibits no edema or tenderness.  Neurological: She is alert.  Skin: Skin is warm and dry.  Psychiatric: She has a normal mood and affect. Her behavior is normal. Thought content normal.  Nursing note and vitals reviewed.    ED Treatments / Results  Labs (all labs ordered are listed, but only abnormal results are displayed) Labs Reviewed  COMPREHENSIVE METABOLIC PANEL - Abnormal; Notable for the following:       Result Value   Sodium 134 (*)    Chloride 98 (*)    Glucose, Bld 118 (*)    Total Protein 8.5 (*)    All other components within normal limits  CBC - Abnormal; Notable for the following:    Hemoglobin 15.9 (*)    All other components within normal limits  URINALYSIS, ROUTINE W REFLEX MICROSCOPIC - Abnormal; Notable for the following:    Color, Urine STRAW (*)    Specific Gravity, Urine 1.003 (*)    Hgb urine dipstick SMALL (*)    Ketones, ur 5 (*)    Squamous Epithelial / LPF 0-5 (*)    All other components within normal limits  LIPASE, BLOOD    EKG  EKG Interpretation None       Radiology No results found.  Procedures Procedures (including critical care  time)  Medications Ordered in ED Medications  sodium chloride 0.9 % bolus 1,000 mL (0 mLs Intravenous Stopped 02/05/17 1738)  ondansetron (ZOFRAN) injection 4 mg (4 mg Intravenous Given 02/05/17 1644)  gi cocktail (Maalox,Lidocaine,Donnatal) (30 mLs Oral Given 02/05/17 1645)  famotidine (PEPCID) IVPB 20 mg premix (0 mg Intravenous Stopped 02/05/17 1721)  pantoprazole (PROTONIX) injection 40 mg (40 mg Intravenous Given 02/05/17 1644)     Initial Impression / Assessment and Plan / ED Course  I have reviewed the triage vital signs and the nursing notes.  Pertinent labs & imaging results that were available during my care of the patient were reviewed by me and considered in my medical decision making (see chart for  details).     30yF with n/v and upper abdominal pain. Afebrile. Nontoxic. Her abdominal exam is actually very benign. More recent history of vomitus becoming brown/black suggests gastritis or PUD. Will treat as such. PT trial. With her symptoms and exam, I doubt imaging would be of much utility. She has a hx of c-section. Obstruction considered but I doubt.   Labs fairly unremarkable. Considerably less ketones on UA today.    Final Clinical Impressions(s) / ED Diagnoses   Final diagnoses:  Epigastric pain  Nausea and vomiting, intractability of vomiting not specified, unspecified vomiting type    New Prescriptions New Prescriptions   No medications on file     Virgel Manifold, MD 02/05/17 1807

## 2017-02-05 NOTE — ED Triage Notes (Signed)
Patient here with complaints of upper abdominal pain x5 days. Nausea/vomting. Pain 10/10.  Reports being seen on 5/15 for same. Also complains of dizziness and blurred vision.

## 2017-02-05 NOTE — ED Notes (Signed)
Patient is alert and oriented x3.  She was given DC instructions and follow up visit instructions.  Patient gave verbal understanding. She was DC ambulatory under her own power to home.  V/S stable.  He was not showing any signs of distress on DC 

## 2020-05-18 ENCOUNTER — Emergency Department (HOSPITAL_COMMUNITY)
Admission: EM | Admit: 2020-05-18 | Discharge: 2020-05-18 | Disposition: A | Discharge status: Home / Self Care | Payer: Self-pay | Attending: Emergency Medicine | Admitting: Emergency Medicine

## 2020-05-18 ENCOUNTER — Other Ambulatory Visit: Payer: Self-pay

## 2020-05-18 ENCOUNTER — Encounter (HOSPITAL_COMMUNITY): Payer: Self-pay | Admitting: Emergency Medicine

## 2020-05-18 DIAGNOSIS — M542 Cervicalgia: Secondary | ICD-10-CM | POA: Insufficient documentation

## 2020-05-18 DIAGNOSIS — Y999 Unspecified external cause status: Secondary | ICD-10-CM | POA: Insufficient documentation

## 2020-05-18 DIAGNOSIS — Y929 Unspecified place or not applicable: Secondary | ICD-10-CM | POA: Insufficient documentation

## 2020-05-18 DIAGNOSIS — M546 Pain in thoracic spine: Secondary | ICD-10-CM | POA: Insufficient documentation

## 2020-05-18 DIAGNOSIS — M25512 Pain in left shoulder: Secondary | ICD-10-CM | POA: Insufficient documentation

## 2020-05-18 DIAGNOSIS — M62838 Other muscle spasm: Secondary | ICD-10-CM

## 2020-05-18 DIAGNOSIS — M62830 Muscle spasm of back: Secondary | ICD-10-CM | POA: Insufficient documentation

## 2020-05-18 DIAGNOSIS — Y939 Activity, unspecified: Secondary | ICD-10-CM | POA: Insufficient documentation

## 2020-05-18 HISTORY — DX: Essential (primary) hypertension: I10

## 2020-05-18 MED ORDER — METHOCARBAMOL 500 MG PO TABS: 1000.0000 mg | ORAL_TABLET | Freq: Two times a day (BID) | ORAL | 0 refills | Status: DC

## 2020-05-18 MED ORDER — METHOCARBAMOL 500 MG PO TABS: 1000.0000 mg | ORAL_TABLET | Freq: Two times a day (BID) | ORAL | 0 refills | Status: AC
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
## 2020-08-07 ENCOUNTER — Other Ambulatory Visit: Payer: Self-pay

## 2020-08-07 ENCOUNTER — Ambulatory Visit (INDEPENDENT_AMBULATORY_CARE_PROVIDER_SITE_OTHER): Payer: 59 | Admitting: Advanced Practice Midwife

## 2020-08-07 ENCOUNTER — Other Ambulatory Visit (HOSPITAL_COMMUNITY)
Admission: RE | Admit: 2020-08-07 | Discharge: 2020-08-07 | Disposition: A | Discharge status: Home / Self Care | Payer: 59 | Source: Ambulatory Visit | Attending: Advanced Practice Midwife | Admitting: Advanced Practice Midwife

## 2020-08-07 ENCOUNTER — Encounter: Payer: Self-pay | Admitting: Advanced Practice Midwife

## 2020-08-07 VITALS — BP 136/97 | HR 71 | Ht 62.0 in | Wt 177.0 lb

## 2020-08-07 DIAGNOSIS — Z01419 Encounter for gynecological examination (general) (routine) without abnormal findings: Secondary | ICD-10-CM | POA: Insufficient documentation

## 2020-08-07 DIAGNOSIS — Z139 Encounter for screening, unspecified: Secondary | ICD-10-CM | POA: Diagnosis not present

## 2020-08-07 DIAGNOSIS — Z113 Encounter for screening for infections with a predominantly sexual mode of transmission: Secondary | ICD-10-CM | POA: Diagnosis not present

## 2020-08-07 NOTE — Progress Notes (Signed)
Pt has no complaints today, would like all testing done. BP elevated today, pt does not have PCP.

## 2020-08-07 NOTE — Progress Notes (Signed)
Subjective:     Lydia Blair is a 34 y.o. female here at [facility] for a routine exam.  Current complaints: none.  Personal health questionnaire reviewed: yes.  Do you have a primary care provider? No Do you feel safe at home? yes    Office Visit from 08/07/2020 in [facility]  PHQ-2 Total Score 0      Risk factors for chronic health problems: Smoking: Former smoker Alchohol/how much: occasional Pt BMI: Body mass index is 32.37 kg/m.   Gynecologic History Patient's last menstrual period was 07/29/2020. Contraception: tubal ligation Last Pap: unknown. Results were: normal Last mammogram: n/a.  Obstetric History OB History  Gravida Para Term Preterm AB Living  $Remov'6 4 3   2 4  'cYHvQC$ SAB TAB Ectopic Multiple Live Births    2     3    # Outcome Date GA Lbr Len/2nd Weight Sex Delivery Anes PTL Lv  6 TAB 2013 [redacted]w[redacted]d       DEC  5 TAB 2013        DEC  4 Term 08/02/09 [redacted]w[redacted]d  6 lb 7 oz (2.92 kg) M CS-LTranv EPI  LIV  3 Term 11/19/06 [redacted]w[redacted]d  7 lb 6 oz (3.345 kg) F CS-LTranv EPI  LIV  2 Term 05/31/04 [redacted]w[redacted]d  6 lb 7 oz (2.92 kg) M CS-LTranv EPI  LIV  1 Para              The following portions of the patient's history were reviewed and updated as appropriate: allergies, current medications, past family history, past medical history, past social history, past surgical history and problem list.  Review of Systems Pertinent items noted in HPI and remainder of comprehensive ROS otherwise negative.    Objective:   BP (!) 136/97    Pulse 71    Ht $R'5\' 2"'BZ$  (1.575 m)    Wt 177 lb (80.3 kg)    LMP 07/29/2020    BMI 32.37 kg/m  VS reviewed, nursing note reviewed,  Constitutional: well developed, well nourished, no distress HEENT: normocephalic CV: normal rate Pulm/chest wall: normal effort Breast Exam:  performed: right breast normal without mass, skin or nipple changes or axillary nodes, left breast normal without mass, skin or nipple changes or axillary nodes Abdomen:  soft Neuro: alert and oriented x 3 Skin: warm, dry Psych: affect normal Pelvic exam:Performed: Cervix pink, visually closed, without lesion, scant white creamy discharge, vaginal walls and external genitalia normal Bimanual exam: Cervix 0/long/high, firm, anterior, neg CMT, uterus nontender, nonenlarged, adnexa without tenderness, enlargement, or mass       Assessment/Plan:   1. Screen for STD (sexually transmitted disease)  - Cervicovaginal ancillary only( [facility]) - HIV Antibody (routine testing w rflx) - Hepatitis B surface antigen - RPR - Hepatitis C antibody  2. Well woman exam with routine gynecological exam --Exam today wnl, STI screening completed --No gyn concerns or complaints --BTL for contraception - Cytology - PAP( [facility])  3. Encounter for health-related screening --Pt without PCP, recommend pt establish care. --Has been diagnosed - Lipid panel - CBC - Comp Met (CMET)       Follow up in: 1 year or as needed.   Fatima Blank, CNM 4:17 PM

## 2020-08-07 NOTE — Patient Instructions (Signed)
[facility] [place], [place], [place] [address] Phone: [phone]  [facility] [place], [place], [place] [address] Hours:  Phone: [phone]   Hypertension, Adult High blood pressure (hypertension) is when the force of blood pumping through the arteries is too strong. The arteries are the blood vessels that carry blood from the heart throughout the body. Hypertension forces the heart to work harder to pump blood and may cause arteries to become narrow or stiff. Untreated or uncontrolled hypertension can cause a heart attack, heart failure, a stroke, kidney disease, and other problems. A blood pressure reading consists of a higher number over a lower number. Ideally, your blood pressure should be below 120/80. The first ("top") number is called the systolic pressure. It is a measure of the pressure in your arteries as your heart beats. The second ("bottom") number is called the diastolic pressure. It is a measure of the pressure in your arteries as the heart relaxes. What are the causes? The exact cause of this condition is not known. There are some conditions that result in or are related to high blood pressure. What increases the risk? Some risk factors for high blood pressure are under your control. The following factors may make you more likely to develop this condition:  Smoking.  Having type 2 diabetes mellitus, high cholesterol, or both.  Not getting enough exercise or physical activity.  Being overweight.  Having too much fat, sugar, calories, or salt (sodium) in your diet.  Drinking too much alcohol. Some risk factors for high blood pressure may be difficult or impossible to change. Some of these factors include:  Having chronic kidney disease.  Having a family history of high blood pressure.  Age. Risk increases with age.  Race. You may be at higher risk if you are African  American.  Gender. Men are at higher risk than women before age 2. After age 71, women are at higher risk than men.  Having obstructive sleep apnea.  Stress. What are the signs or symptoms? High blood pressure may not cause symptoms. Very high blood pressure (hypertensive crisis) may cause:  Headache.  Anxiety.  Shortness of breath.  Nosebleed.  Nausea and vomiting.  Vision changes.  Severe chest pain.  Seizures. How is this diagnosed? This condition is diagnosed by measuring your blood pressure while you are seated, with your arm resting on a flat surface, your legs uncrossed, and your feet flat on the floor. The cuff of the blood pressure monitor will be placed directly against the skin of your upper arm at the level of your heart. It should be measured at least twice using the same arm. Certain conditions can cause a difference in blood pressure between your right and left arms. Certain factors can cause blood pressure readings to be lower or higher than normal for a short period of time:  When your blood pressure is higher when you are in a health care provider's office than when you are at home, this is called white coat hypertension. Most people with this condition do not need medicines.  When your blood pressure is higher at home than when you are in a health care provider's office, this is called masked hypertension. Most people with this condition may need medicines to control blood pressure. If you have a high blood pressure reading during one visit or you have normal blood pressure with other risk factors,  you may be asked to:  Return on a different day to have your blood pressure checked again.  Monitor your blood pressure at home for 1 week or longer. If you are diagnosed with hypertension, you may have other blood or imaging tests to help your health care provider understand your overall risk for other conditions. How is this treated? This condition is treated by  making healthy lifestyle changes, such as eating healthy foods, exercising more, and reducing your alcohol intake. Your health care provider may prescribe medicine if lifestyle changes are not enough to get your blood pressure under control, and if:  Your systolic blood pressure is above 130.  Your diastolic blood pressure is above 80. Your personal target blood pressure may vary depending on your medical conditions, your age, and other factors. Follow these instructions at home: Eating and drinking   Eat a diet that is high in fiber and potassium, and low in sodium, added sugar, and fat. An example eating plan is called the DASH (Dietary Approaches to Stop Hypertension) diet. To eat this way: ? Eat plenty of fresh fruits and vegetables. Try to fill one half of your plate at each meal with fruits and vegetables. ? Eat whole grains, such as whole-wheat pasta, brown rice, or whole-grain bread. Fill about one fourth of your plate with whole grains. ? Eat or drink low-fat dairy products, such as skim milk or low-fat yogurt. ? Avoid fatty cuts of meat, processed or cured meats, and poultry with skin. Fill about one fourth of your plate with lean proteins, such as fish, chicken without skin, beans, eggs, or tofu. ? Avoid pre-made and processed foods. These tend to be higher in sodium, added sugar, and fat.  Reduce your daily sodium intake. Most people with hypertension should eat less than 1,500 mg of sodium a day.  Do not drink alcohol if: ? Your health care provider tells you not to drink. ? You are pregnant, may be pregnant, or are planning to become pregnant.  If you drink alcohol: ? Limit how much you use to:  0-1 drink a day for women.  0-2 drinks a day for men. ? Be aware of how much alcohol is in your drink. In the [place] one drink equals one 12 oz bottle of beer (355 mL), one 5 oz glass of wine (148 mL), or one 1 oz glass of hard liquor (44 mL). Lifestyle   Work with your health  care provider to maintain a healthy body weight or to lose weight. Ask what an ideal weight is for you.  Get at least 30 minutes of exercise most days of the week. Activities may include walking, swimming, or biking.  Include exercise to strengthen your muscles (resistance exercise), such as Pilates or lifting weights, as part of your weekly exercise routine. Try to do these types of exercises for 30 minutes at least 3 days a week.  Do not use any products that contain nicotine or tobacco, such as cigarettes, e-cigarettes, and chewing tobacco. If you need help quitting, ask your health care provider.  Monitor your blood pressure at home as told by your health care provider.  Keep all follow-up visits as told by your health care provider. This is important. Medicines  Take over-the-counter and prescription medicines only as told by your health care provider. Follow directions carefully. Blood pressure medicines must be taken as prescribed.  Do not skip doses of blood pressure medicine. Doing this puts you at risk for problems and  can make the medicine less effective.  Ask your health care provider about side effects or reactions to medicines that you should watch for. Contact a health care provider if you:  Think you are having a reaction to a medicine you are taking.  Have headaches that keep coming back (recurring).  Feel dizzy.  Have swelling in your ankles.  Have trouble with your vision. Get help right away if you:  Develop a severe headache or confusion.  Have unusual weakness or numbness.  Feel faint.  Have severe pain in your chest or abdomen.  Vomit repeatedly.  Have trouble breathing. Summary  Hypertension is when the force of blood pumping through your arteries is too strong. If this condition is not controlled, it may put you at risk for serious complications.  Your personal target blood pressure may vary depending on your medical conditions, your age, and  other factors. For most people, a normal blood pressure is less than 120/80.  Hypertension is treated with lifestyle changes, medicines, or a combination of both. Lifestyle changes include losing weight, eating a healthy, low-sodium diet, exercising more, and limiting alcohol. This information is not intended to replace advice given to you by your health care provider. Make sure you discuss any questions you have with your health care provider. Document Revised: 05/19/2018 Document Reviewed: 05/19/2018 Elsevier Patient Education  2020 [employer].

## 2020-08-08 LAB — CERVICOVAGINAL ANCILLARY ONLY
Chlamydia: NEGATIVE
Comment: NEGATIVE
Comment: NEGATIVE
Comment: NORMAL
Neisseria Gonorrhea: NEGATIVE
Trichomonas: NEGATIVE

## 2020-08-08 LAB — CBC
Hematocrit: 40 % (ref 34.0–46.6)
Hemoglobin: 13.7 g/dL (ref 11.1–15.9)
MCH: 30.6 pg (ref 26.6–33.0)
MCHC: 34.3 g/dL (ref 31.5–35.7)
MCV: 89 fL (ref 79–97)
Platelets: 304 10*3/uL (ref 150–450)
RBC: 4.48 x10E6/uL (ref 3.77–5.28)
RDW: 12.9 % (ref 11.7–15.4)
WBC: 5.5 10*3/uL (ref 3.4–10.8)

## 2020-08-08 LAB — LIPID PANEL
Chol/HDL Ratio: 3.1 ratio (ref 0.0–4.4)
Cholesterol, Total: 195 mg/dL (ref 100–199)
HDL: 62 mg/dL (ref >39)
LDL Chol Calc (NIH): 116 mg/dL — ABNORMAL HIGH (ref 0–99)
Triglycerides: 93 mg/dL (ref 0–149)
VLDL Cholesterol Cal: 17 mg/dL (ref 5–40)

## 2020-08-08 LAB — COMPREHENSIVE METABOLIC PANEL
ALT: 13 IU/L (ref 0–32)
AST: 16 IU/L (ref 0–40)
Albumin/Globulin Ratio: 1.5 (ref 1.2–2.2)
Albumin: 4.4 g/dL (ref 3.8–4.8)
Alkaline Phosphatase: 67 IU/L (ref 44–121)
BUN/Creatinine Ratio: 12 (ref 9–23)
BUN: 9 mg/dL (ref 6–20)
Bilirubin Total: 0.5 mg/dL (ref 0.0–1.2)
CO2: 27 mmol/L (ref 20–29)
Calcium: 9.4 mg/dL (ref 8.7–10.2)
Chloride: 100 mmol/L (ref 96–106)
Creatinine, Ser: 0.76 mg/dL (ref 0.57–1.00)
GFR calc Af Amer: 117 mL/min/{1.73_m2} (ref >59)
GFR calc non Af Amer: 101 mL/min/{1.73_m2} (ref >59)
Globulin, Total: 3 g/dL (ref 1.5–4.5)
Glucose: 94 mg/dL (ref 65–99)
Potassium: 3.8 mmol/L (ref 3.5–5.2)
Sodium: 138 mmol/L (ref 134–144)
Total Protein: 7.4 g/dL (ref 6.0–8.5)

## 2020-08-08 LAB — HEPATITIS B SURFACE ANTIGEN: Hepatitis B Surface Ag: NEGATIVE

## 2020-08-08 LAB — RPR: RPR Ser Ql: NONREACTIVE

## 2020-08-08 LAB — HIV ANTIBODY (ROUTINE TESTING W REFLEX): HIV Screen 4th Generation wRfx: NONREACTIVE

## 2020-08-08 LAB — HEPATITIS C ANTIBODY: Hep C Virus Ab: <0.1 s/co ratio (ref 0.0–0.9)

## 2020-08-10 LAB — CYTOLOGY - PAP
Adequacy: ABSENT
Comment: NEGATIVE
Diagnosis: NEGATIVE
High risk HPV: NEGATIVE

## 2020-10-17 ENCOUNTER — Ambulatory Visit: Payer: 59

## 2020-10-26 ENCOUNTER — Other Ambulatory Visit (HOSPITAL_COMMUNITY)
Admission: RE | Admit: 2020-10-26 | Discharge: 2020-10-26 | Disposition: A | Discharge status: Home / Self Care | Payer: 59 | Source: Ambulatory Visit | Attending: Obstetrics | Admitting: Obstetrics

## 2020-10-26 ENCOUNTER — Ambulatory Visit: Payer: 59

## 2020-10-26 ENCOUNTER — Other Ambulatory Visit: Payer: Self-pay

## 2020-10-26 DIAGNOSIS — N898 Other specified noninflammatory disorders of vagina: Secondary | ICD-10-CM

## 2020-10-26 NOTE — Progress Notes (Signed)
Chart reviewed for nurse visit. Agree with plan of care.   Clarnce Flock, MD 10/26/20 12:30 PM

## 2020-10-26 NOTE — Progress Notes (Signed)
Pt is in the office for self swab. Pt reports vaginal odor, discharge

## 2020-10-28 LAB — CERVICOVAGINAL ANCILLARY ONLY
Bacterial Vaginitis (gardnerella): POSITIVE — AB
Candida Glabrata: NEGATIVE
Candida Vaginitis: NEGATIVE
Chlamydia: NEGATIVE
Comment: NEGATIVE
Comment: NEGATIVE
Comment: NEGATIVE
Comment: NEGATIVE
Comment: NEGATIVE
Comment: NORMAL
Neisseria Gonorrhea: NEGATIVE
Trichomonas: NEGATIVE

## 2020-10-29 ENCOUNTER — Telehealth: Payer: Self-pay

## 2020-10-29 ENCOUNTER — Telehealth: Payer: Self-pay | Admitting: Family Medicine

## 2020-10-29 MED ORDER — METRONIDAZOLE 500 MG PO TABS: 500.0000 mg | ORAL_TABLET | Freq: Two times a day (BID) | ORAL | 0 refills | Status: AC

## 2020-10-29 NOTE — Telephone Encounter (Signed)
Patient has been informed of test results.  

## 2020-10-29 NOTE — Telephone Encounter (Signed)
Patient does not have mychart. Please notify her that swab came back positive for BV and I have sent treatment.

## 2020-11-08 ENCOUNTER — Encounter (HOSPITAL_COMMUNITY): Payer: Self-pay

## 2020-11-08 ENCOUNTER — Ambulatory Visit (HOSPITAL_COMMUNITY)
Admission: EM | Admit: 2020-11-08 | Discharge: 2020-11-08 | Disposition: A | Discharge status: Home / Self Care | Payer: 59 | Attending: Family Medicine | Admitting: Family Medicine

## 2020-11-08 DIAGNOSIS — R030 Elevated blood-pressure reading, without diagnosis of hypertension: Secondary | ICD-10-CM | POA: Diagnosis not present

## 2020-11-08 DIAGNOSIS — H538 Other visual disturbances: Secondary | ICD-10-CM | POA: Diagnosis not present

## 2020-11-08 LAB — COMPREHENSIVE METABOLIC PANEL
ALT: 17 U/L (ref 0–44)
AST: 18 U/L (ref 15–41)
Albumin: 4.3 g/dL (ref 3.5–5.0)
Alkaline Phosphatase: 50 U/L (ref 38–126)
Anion gap: 8 (ref 5–15)
BUN: 8 mg/dL (ref 6–20)
CO2: 24 mmol/L (ref 22–32)
Calcium: 9.2 mg/dL (ref 8.9–10.3)
Chloride: 104 mmol/L (ref 98–111)
Creatinine, Ser: 0.75 mg/dL (ref 0.44–1.00)
GFR, Estimated: >60 mL/min (ref >60)
Glucose, Bld: 93 mg/dL (ref 70–99)
Potassium: 4.6 mmol/L (ref 3.5–5.1)
Sodium: 136 mmol/L (ref 135–145)
Total Bilirubin: 0.6 mg/dL (ref 0.3–1.2)
Total Protein: 7.9 g/dL (ref 6.5–8.1)

## 2020-11-08 LAB — CBC WITH DIFFERENTIAL/PLATELET
Abs Immature Granulocytes: 0.01 10*3/uL (ref 0.00–0.07)
Basophils Absolute: 0 10*3/uL (ref 0.0–0.1)
Basophils Relative: 0 %
Eosinophils Absolute: 0 10*3/uL (ref 0.0–0.5)
Eosinophils Relative: 1 %
HCT: 44.9 % (ref 36.0–46.0)
Hemoglobin: 15.1 g/dL — ABNORMAL HIGH (ref 12.0–15.0)
Immature Granulocytes: 0 %
Lymphocytes Relative: 32 %
Lymphs Abs: 1.7 10*3/uL (ref 0.7–4.0)
MCH: 30.2 pg (ref 26.0–34.0)
MCHC: 33.6 g/dL (ref 30.0–36.0)
MCV: 89.8 fL (ref 80.0–100.0)
Monocytes Absolute: 0.5 10*3/uL (ref 0.1–1.0)
Monocytes Relative: 9 %
Neutro Abs: 3.1 10*3/uL (ref 1.7–7.7)
Neutrophils Relative %: 58 %
Platelets: 282 10*3/uL (ref 150–400)
RBC: 5 MIL/uL (ref 3.87–5.11)
RDW: 12 % (ref 11.5–15.5)
WBC: 5.3 10*3/uL (ref 4.0–10.5)
nRBC: 0 % (ref 0.0–0.2)

## 2020-11-08 MED ORDER — AMLODIPINE BESYLATE 2.5 MG PO TABS: 2.5000 mg | ORAL_TABLET | Freq: Every day | ORAL | 1 refills | Status: DC

## 2020-11-08 NOTE — ED Triage Notes (Signed)
Pt c/o blurred vision and presents with concerns of elevated blood pressure. Pt states she check her blood pressure this morning and states it was 158/102. Pt denies headaches. Pt states she feels light headed.

## 2020-11-08 NOTE — ED Provider Notes (Signed)
[place]    CSN: [id number] Arrival date & time: 11/08/20  3976      History   Chief Complaint Chief Complaint  Patient presents with  . Hypertension  . Blurred Vision    HPI Lydia Blair is a 35 y.o. female.   Here today with mild blurred vision intermittently since this morning. States she checked her BP and notes it was in the 150s/90s range which is about baseline the past year or so when she checks. Has hx of HTN in pregnancy but has not been diagnosed formally or ever placed on medication for this that she can remember. Denies headache, double vision, CP, SOB, mental status changes, fever, head injury. Has not tried anything OTC for sxs. Does not have a PCP and has not had an eye exam in years.      Past Medical History:  Diagnosis Date  . Cardiomyopathy ([place]) 2014  . Dermoid cyst of left orbit   . Hypertension   . Prehypertension     Patient Active Problem List   Diagnosis Date Noted  . Hx of cardiomyopathy 09/26/2015  . Essential hypertension   . Heart palpitations   . Cellulitis 09/24/2015  . Cardiomyopathy ([place]) 05/02/2015  . Palpitation 05/02/2015  . SOB (shortness of breath) 05/02/2015  . BV (bacterial vaginosis) 02/08/2014  . Vaginitis and vulvovaginitis, unspecified 06/02/2013    Past Surgical History:  Procedure Laterality Date  . CESAREAN SECTION    . INCISE AND DRAIN ABCESS    . WISDOM TOOTH EXTRACTION      OB History    Gravida  6   Para  4   Term  3   Preterm      AB  2   Living  4     SAB      IAB  2   Ectopic      Multiple      Live Births  3            Home Medications    Prior to Admission medications   Medication Sig Start Date End Date Taking? Authorizing Provider  amLODipine (NORVASC) 2.5 MG tablet Take 1 tablet (2.5 mg total) by mouth daily. 11/08/20  Yes Volney American, PA-C  albuterol (PROVENTIL HFA;VENTOLIN HFA) 108 (90 BASE) MCG/ACT inhaler Inhale 2 puffs into the lungs  every 6 (six) hours as needed for wheezing or shortness of breath. Patient not taking: No sig reported 05/16/15   Kandis Cocking A, CNM  Multiple Vitamins-Minerals (MULTIVITAMIN WITH MINERALS) tablet Take 1 tablet by mouth daily.    [provider]  omeprazole (PRILOSEC) 20 MG capsule Take 1 capsule (20 mg total) by mouth 2 (two) times daily before a meal. Patient not taking: No sig reported 02/05/17   Virgel Manifold, MD    Family History Family History  Problem Relation Age of Onset  . Diabetes Maternal Grandmother   . Hypertension Maternal Grandmother   . Hypertension Maternal Grandfather   . Hypertension Paternal Grandfather   . HIV Father   . Hypertension Sister     Social History Social History   Tobacco Use  . Smoking status: Former Smoker    Quit date: 01/29/2015    Years since quitting: 5.7  . Smokeless tobacco: Never Used  Substance Use Topics  . Alcohol use: Yes    Alcohol/week: 0.0 standard drinks    Comment: occ  . Drug use: No     Allergies   Vicodin [hydrocodone-acetaminophen]  Review of Systems Review of Systems PER HPI   Physical Exam Triage Vital Signs ED Triage Vitals  Enc Vitals Group     BP 11/08/20 1025 (!) 142/99     Pulse Rate 11/08/20 1025 83     Resp 11/08/20 1025 18     Temp 11/08/20 1025 99.3 F (37.4 C)     Temp Source 11/08/20 1025 Oral     SpO2 11/08/20 1025 99 %     Weight --      Height --      Head Circumference --      Peak Flow --      Pain Score 11/08/20 1024 0     Pain Loc --      Pain Edu? --      Excl. in [place]? --    No data found.  Updated Vital Signs BP (!) 142/99 (BP Location: Right Arm)   Pulse 83   Temp 99.3 F (37.4 C) (Oral)   Resp 18   LMP 10/15/2020 (Approximate)   SpO2 99%   Visual Acuity Right Eye Distance: 20/20 (Without correction ) Left Eye Distance: 20/30 (Without correction ) Bilateral Distance: 20/20 (Without correction )  Right Eye Near:   Left Eye Near:    Bilateral Near:      Physical Exam Vitals and nursing note reviewed.  Constitutional:      Appearance: Normal appearance. She is not ill-appearing.  HENT:     Head: Atraumatic.     Mouth/Throat:     Mouth: Mucous membranes are moist.     Pharynx: Oropharynx is clear.  Eyes:     Extraocular Movements: Extraocular movements intact.     Conjunctiva/sclera: Conjunctivae normal.     Pupils: Pupils are equal, round, and reactive to light.  Cardiovascular:     Rate and Rhythm: Normal rate and regular rhythm.     Heart sounds: Normal heart sounds.  Pulmonary:     Effort: Pulmonary effort is normal.     Breath sounds: Normal breath sounds.  Musculoskeletal:        General: Normal range of motion.     Cervical back: Normal range of motion and neck supple.  Skin:    General: Skin is warm and dry.  Neurological:     General: No focal deficit present.     Mental Status: She is alert and oriented to person, place, and time.     Cranial Nerves: No cranial nerve deficit.     Motor: No weakness.     Gait: Gait normal.  Psychiatric:        Mood and Affect: Mood normal.        Thought Content: Thought content normal.        Judgment: Judgment normal.      UC Treatments / Results  Labs (all labs ordered are listed, but only abnormal results are displayed) Labs Reviewed  CBC WITH DIFFERENTIAL/PLATELET  COMPREHENSIVE METABOLIC PANEL    EKG   Radiology No results found.  Procedures Procedures (including critical care time)  Medications Ordered in UC Medications - No data to display  Initial Impression / Assessment and Plan / UC Course  I have reviewed the triage vital signs and the nursing notes.  Pertinent labs & imaging results that were available during my care of the patient were reviewed by me and considered in my medical decision making (see chart for details).     Blurred vision has now resolved, was a brief episode  this morning. Visual acuity WNL today when checked, neurologic exam  without deficits, no distress at this time. Will run basic labs for r/o organic causes and start very low dose amlodipine to help with what per chart review appears to be chronic elevated BPs. Close PCP f/u recommended, DASH diet, home BP log/monitoring. Return for acutely worsening sxs, ED if having headaches, worsened vision problems, CP, SOB.   Final Clinical Impressions(s) / UC Diagnoses   Final diagnoses:  Elevated blood pressure reading  Blurred vision     Discharge Instructions     Go to the ER if your symptoms worsen at any time    ED Prescriptions    Medication Sig Dispense Auth. Provider   amLODipine (NORVASC) 2.5 MG tablet Take 1 tablet (2.5 mg total) by mouth daily. 30 tablet Volney American, [place]     PDMP not reviewed this encounter.   Volney American, [place] 11/08/20 1135

## 2020-11-08 NOTE — Discharge Instructions (Addendum)
Go to the ER if your symptoms worsen at any time

## 2020-11-30 ENCOUNTER — Other Ambulatory Visit: Payer: Self-pay | Admitting: Family Medicine

## 2021-05-23 ENCOUNTER — Emergency Department (HOSPITAL_BASED_OUTPATIENT_CLINIC_OR_DEPARTMENT_OTHER)
Admission: EM | Admit: 2021-05-23 | Discharge: 2021-05-23 | Disposition: A | Discharge status: Home / Self Care | Payer: 59 | Attending: Emergency Medicine | Admitting: Emergency Medicine

## 2021-05-23 ENCOUNTER — Other Ambulatory Visit: Payer: Self-pay

## 2021-05-23 ENCOUNTER — Encounter (HOSPITAL_BASED_OUTPATIENT_CLINIC_OR_DEPARTMENT_OTHER): Payer: Self-pay

## 2021-05-23 DIAGNOSIS — Z79899 Other long term (current) drug therapy: Secondary | ICD-10-CM | POA: Insufficient documentation

## 2021-05-23 DIAGNOSIS — I10 Essential (primary) hypertension: Secondary | ICD-10-CM | POA: Insufficient documentation

## 2021-05-23 DIAGNOSIS — L0390 Cellulitis, unspecified: Secondary | ICD-10-CM

## 2021-05-23 DIAGNOSIS — R2232 Localized swelling, mass and lump, left upper limb: Secondary | ICD-10-CM | POA: Insufficient documentation

## 2021-05-23 DIAGNOSIS — Z87891 Personal history of nicotine dependence: Secondary | ICD-10-CM | POA: Insufficient documentation

## 2021-05-23 DIAGNOSIS — L539 Erythematous condition, unspecified: Secondary | ICD-10-CM | POA: Insufficient documentation

## 2021-05-23 MED ORDER — DOXYCYCLINE HYCLATE 100 MG PO CAPS: 100.0000 mg | ORAL_CAPSULE | Freq: Two times a day (BID) | ORAL | 0 refills | Status: DC

## 2021-05-23 NOTE — ED Provider Notes (Signed)
[facility] Provider Note   CSN: [id number] Arrival date & time: 05/23/21  1902     History Chief Complaint  Patient presents with   Insect Bite    Lydia Blair is a 35 y.o. female.  35 year old female complains of insect bite to left forearm.  Has had increased redness and swelling since yesterday.  Denies any fever or chills.  Has used over-the-counter medications without relief.      Past Medical History:  Diagnosis Date   Cardiomyopathy [facility]) 2014   Dermoid cyst of left orbit    Hypertension    Prehypertension     Patient Active Problem List   Diagnosis Date Noted   Hx of cardiomyopathy 09/26/2015   Essential hypertension    Heart palpitations    Cellulitis 09/24/2015   Cardiomyopathy ([place]) 05/02/2015   Palpitation 05/02/2015   SOB (shortness of breath) 05/02/2015   BV (bacterial vaginosis) 02/08/2014   Vaginitis and vulvovaginitis, unspecified 06/02/2013    Past Surgical History:  Procedure Laterality Date   CESAREAN SECTION     INCISE AND DRAIN ABCESS     WISDOM TOOTH EXTRACTION       OB History     Gravida  6   Para  4   Term  3   Preterm      AB  2   Living  4      SAB      IAB  2   Ectopic      Multiple      Live Births  3           Family History  Problem Relation Age of Onset   Diabetes Maternal Grandmother    Hypertension Maternal Grandmother    Hypertension Maternal Grandfather    Hypertension Paternal Grandfather    HIV Father    Hypertension Sister     Social History   Tobacco Use   Smoking status: Former    Types: Cigarettes    Quit date: 01/29/2015    Years since quitting: 6.3   Smokeless tobacco: Never  Substance Use Topics   Alcohol use: Yes    Alcohol/week: 0.0 standard drinks    Comment: occ   Drug use: No    Home Medications Prior to Admission medications   Medication Sig Start Date End Date Taking? Authorizing Provider  albuterol (PROVENTIL HFA;VENTOLIN HFA)  108 (90 BASE) MCG/ACT inhaler Inhale 2 puffs into the lungs every 6 (six) hours as needed for wheezing or shortness of breath. Patient not taking: No sig reported 05/16/15   Kandis Cocking A, CNM  amLODipine (NORVASC) 2.5 MG tablet Take 1 tablet (2.5 mg total) by mouth daily. 11/08/20   Volney American, PA-C  Multiple Vitamins-Minerals (MULTIVITAMIN WITH MINERALS) tablet Take 1 tablet by mouth daily.    [provider]  omeprazole (PRILOSEC) 20 MG capsule Take 1 capsule (20 mg total) by mouth 2 (two) times daily before a meal. Patient not taking: No sig reported 02/05/17   Virgel Manifold, MD    Allergies    Vicodin [hydrocodone-acetaminophen]  Review of Systems   Review of Systems  All other systems reviewed and are negative.  Physical Exam Updated Vital Signs BP 129/88 (BP Location: Right Arm)   Pulse 90   Temp 98.2 F (36.8 C) (Oral)   Resp 18   Ht 1.626 m ('5\' 4"'$ )   Wt 83 kg   LMP 05/14/2021 (Exact Date)   SpO2 100%   BMI  31.41 kg/m   Physical Exam Vitals and nursing note reviewed.  Constitutional:      General: She is not in acute distress.    Appearance: Normal appearance. She is well-developed. She is not toxic-appearing.  HENT:     Head: Normocephalic and atraumatic.  Eyes:     General: Lids are normal.     Conjunctiva/sclera: Conjunctivae normal.     Pupils: Pupils are equal, round, and reactive to light.  Neck:     Thyroid: No thyroid mass.     Trachea: No tracheal deviation.  Cardiovascular:     Rate and Rhythm: Normal rate and regular rhythm.     Heart sounds: Normal heart sounds. No murmur heard.   No gallop.  Pulmonary:     Effort: Pulmonary effort is normal. No respiratory distress.     Breath sounds: Normal breath sounds. No stridor. No decreased breath sounds, wheezing, rhonchi or rales.  Abdominal:     General: There is no distension.     Palpations: Abdomen is soft.     Tenderness: There is no abdominal tenderness. There is no  rebound.  Musculoskeletal:        General: No tenderness. Normal range of motion.       Arms:     Cervical back: Normal range of motion and neck supple.  Skin:    General: Skin is warm and dry.     Findings: No abrasion or rash.  Neurological:     Mental Status: She is alert and oriented to person, place, and time. Mental status is at baseline.     GCS: GCS eye subscore is 4. GCS verbal subscore is 5. GCS motor subscore is 6.     Cranial Nerves: Cranial nerves are intact. No cranial nerve deficit.     Sensory: No sensory deficit.     Motor: Motor function is intact.  Psychiatric:        Attention and Perception: Attention normal.        Speech: Speech normal.        Behavior: Behavior normal.  Will  ED Results / Procedures / Treatments   Labs (all labs ordered are listed, but only abnormal results are displayed) Labs Reviewed - No data to display  EKG None  Radiology No results found.  Procedures Procedures   Medications Ordered in ED Medications - No data to display  ED Course  I have reviewed the triage vital signs and the nursing notes.  Pertinent labs & imaging results that were available during my care of the patient were reviewed by me and considered in my medical decision making (see chart for details).    MDM Rules/Calculators/A&P                          Will place on doxycycline for treatment of possible early cellulitis Final Clinical Impression(s) / ED Diagnoses Final diagnoses:  None    Rx / DC Orders ED Discharge Orders     None        Lacretia Leigh, MD 05/23/21 2053

## 2021-05-23 NOTE — ED Triage Notes (Signed)
Pt reports swelling to left forearm - states she was bitten by insect yesterday - denies fever

## 2021-08-22 ENCOUNTER — Other Ambulatory Visit: Payer: Self-pay

## 2021-08-22 ENCOUNTER — Ambulatory Visit (HOSPITAL_COMMUNITY)
Admission: EM | Admit: 2021-08-22 | Discharge: 2021-08-22 | Disposition: A | Discharge status: Home / Self Care | Payer: Commercial Managed Care - PPO | Attending: Emergency Medicine | Admitting: Emergency Medicine

## 2021-08-22 ENCOUNTER — Encounter (HOSPITAL_COMMUNITY): Payer: Self-pay

## 2021-08-22 DIAGNOSIS — J029 Acute pharyngitis, unspecified: Secondary | ICD-10-CM

## 2021-08-22 DIAGNOSIS — R21 Rash and other nonspecific skin eruption: Secondary | ICD-10-CM | POA: Diagnosis not present

## 2021-08-22 MED ORDER — CLOBETASOL PROPIONATE 0.05 % EX CREA: 1.0000 "application " | TOPICAL_CREAM | Freq: Two times a day (BID) | CUTANEOUS | 0 refills | Status: DC

## 2021-08-22 MED ORDER — LIDOCAINE VISCOUS HCL 2 % MT SOLN: 15.0000 mL | OROMUCOSAL | 0 refills | Status: DC | PRN

## 2021-08-22 NOTE — ED Provider Notes (Signed)
[place]    CSN: [id number] Arrival date & time: 08/22/21  1842      History   Chief Complaint Chief Complaint  Patient presents with   Sore Throat    Bumps on the arm    HPI Lydia Blair is a 35 y.o. female.   Patient presents with intermittent bumps on arm bilaterally. Initially thought it was related the sun exposure. Rash is pruritic at times. attempted use of hydrocortisone which is not helpful, Denies fever, chills, drainage. Has not spread to other places of the body.   Patient presents with sore throat and nasal congestion for 1 day after breathing in fumes from truck. woke up this morning with hoarseness. denies pain when swallowing, coughing, non productive, denies fever chills or body aches. attempted use of tea and cough drops, which are not effective.  No known sick contacts.  Past Medical History:  Diagnosis Date   Cardiomyopathy [facility]) 2014   Dermoid cyst of left orbit    Hypertension    Prehypertension     Patient Active Problem List   Diagnosis Date Noted   Hx of cardiomyopathy 09/26/2015   Essential hypertension    Heart palpitations    Cellulitis 09/24/2015   Cardiomyopathy ([place]) 05/02/2015   Palpitation 05/02/2015   SOB (shortness of breath) 05/02/2015   BV (bacterial vaginosis) 02/08/2014   Vaginitis and vulvovaginitis, unspecified 06/02/2013    Past Surgical History:  Procedure Laterality Date   CESAREAN SECTION     INCISE AND DRAIN ABCESS     WISDOM TOOTH EXTRACTION      OB History     Gravida  6   Para  4   Term  3   Preterm      AB  2   Living  4      SAB      IAB  2   Ectopic      Multiple      Live Births  3            Home Medications    Prior to Admission medications   Medication Sig Start Date End Date Taking? Authorizing Provider  albuterol (PROVENTIL HFA;VENTOLIN HFA) 108 (90 BASE) MCG/ACT inhaler Inhale 2 puffs into the lungs every 6 (six) hours as needed for wheezing or shortness  of breath. Patient not taking: No sig reported 05/16/15   Kandis Cocking A, CNM  amLODipine (NORVASC) 2.5 MG tablet Take 1 tablet (2.5 mg total) by mouth daily. 11/08/20   Volney American, PA-C  doxycycline (VIBRAMYCIN) 100 MG capsule Take 1 capsule (100 mg total) by mouth 2 (two) times daily. 05/23/21   Lacretia Leigh, MD  Multiple Vitamins-Minerals (MULTIVITAMIN WITH MINERALS) tablet Take 1 tablet by mouth daily.    [provider]  omeprazole (PRILOSEC) 20 MG capsule Take 1 capsule (20 mg total) by mouth 2 (two) times daily before a meal. Patient not taking: No sig reported 02/05/17   Virgel Manifold, MD    Family History Family History  Problem Relation Age of Onset   Diabetes Maternal Grandmother    Hypertension Maternal Grandmother    Hypertension Maternal Grandfather    Hypertension Paternal Grandfather    HIV Father    Hypertension Sister     Social History Social History   Tobacco Use   Smoking status: Former    Types: Cigarettes    Quit date: 01/29/2015    Years since quitting: 6.5   Smokeless tobacco: Never  Substance Use Topics   Alcohol use: Yes    Alcohol/week: 0.0 standard drinks    Comment: occ   Drug use: No     Allergies   Vicodin [hydrocodone-acetaminophen]   Review of Systems Review of Systems  Constitutional: Negative.   HENT:  Positive for congestion and sore throat. Negative for dental problem, drooling, ear discharge, ear pain, facial swelling, hearing loss, mouth sores, nosebleeds, postnasal drip, rhinorrhea, sinus pressure, sinus pain, sneezing, tinnitus, trouble swallowing and voice change.   Respiratory: Negative.    Cardiovascular: Negative.   Gastrointestinal: Negative.   Skin:  Positive for rash. Negative for color change, pallor and wound.  Neurological: Negative.     Physical Exam Triage Vital Signs ED Triage Vitals [08/22/21 1911]  Enc Vitals Group     BP 134/89     Pulse Rate 74     Resp      Temp 98.2 F (36.8  C)     Temp Source Oral     SpO2 100 %     Weight      Height      Head Circumference      Peak Flow      Pain Score 0     Pain Loc      Pain Edu?      Excl. in [place]?    No data found.  Updated Vital Signs BP 134/89 (BP Location: Right Arm)   Pulse 74   Temp 98.2 F (36.8 C) (Oral)   LMP 08/06/2021 (Exact Date)   SpO2 100%   Visual Acuity Right Eye Distance:   Left Eye Distance:   Bilateral Distance:    Right Eye Near:   Left Eye Near:    Bilateral Near:     Physical Exam Constitutional:      Appearance: Normal appearance. She is normal weight.  HENT:     Head: Normocephalic.     Right Ear: Tympanic membrane, ear canal and external ear normal.     Left Ear: Tympanic membrane, ear canal and external ear normal.     Nose: Congestion present. No rhinorrhea.     Mouth/Throat:     Mouth: Mucous membranes are moist.     Pharynx: Posterior oropharyngeal erythema present.  Eyes:     Extraocular Movements: Extraocular movements intact.  Cardiovascular:     Rate and Rhythm: Normal rate and regular rhythm.     Pulses: Normal pulses.     Heart sounds: Normal heart sounds.  Pulmonary:     Effort: Pulmonary effort is normal.     Breath sounds: Normal breath sounds.  Musculoskeletal:     Cervical back: Normal range of motion and neck supple.  Skin:    Comments: Diffuse flesh tone fine papular rash present on the posterior bilateral forearms and upper arms, no edema or swelling, no drainage noted  Neurological:     Mental Status: She is alert and oriented to person, place, and time. Mental status is at baseline.  Psychiatric:        Mood and Affect: Mood normal.        Behavior: Behavior normal.     UC Treatments / Results  Labs (all labs ordered are listed, but only abnormal results are displayed) Labs Reviewed - No data to display  EKG   Radiology No results found.  Procedures Procedures (including critical care time)  Medications Ordered in  UC Medications - No data to display  Initial Impression / Assessment and Plan /  UC Course  I have reviewed the triage vital signs and the nursing notes.  Pertinent labs & imaging results that were available during my care of the patient were reviewed by me and considered in my medical decision making (see chart for details).  Rash Sore throat  1.  Lidocaine viscous 2% 15 mils every 4 hours as needed, recommended salt water gargles, throat lozenges, Listerine gargles and over-the-counter medications for symptom management 2.  Clobetasol cream 0.05% twice daily to affected area until clear, may follow-up with urgent care as needed Final Clinical Impressions(s) / UC Diagnoses   Final diagnoses:  None   Discharge Instructions   None    ED Prescriptions   None    PDMP not reviewed this encounter.   Hans Eden, [place] 08/25/21 [id number]

## 2021-08-22 NOTE — ED Triage Notes (Signed)
Pt presents to the office today for sore throat and bumps on her arm.

## 2021-08-22 NOTE — Discharge Instructions (Signed)
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

## 2022-05-07 ENCOUNTER — Encounter: Payer: Self-pay | Admitting: Student

## 2022-05-07 ENCOUNTER — Other Ambulatory Visit (HOSPITAL_COMMUNITY)
Admission: RE | Admit: 2022-05-07 | Discharge: 2022-05-07 | Disposition: A | Discharge status: Home / Self Care | Payer: Commercial Managed Care - PPO | Source: Ambulatory Visit | Attending: Student | Admitting: Student

## 2022-05-07 ENCOUNTER — Ambulatory Visit (INDEPENDENT_AMBULATORY_CARE_PROVIDER_SITE_OTHER): Payer: Commercial Managed Care - PPO

## 2022-05-07 ENCOUNTER — Ambulatory Visit (INDEPENDENT_AMBULATORY_CARE_PROVIDER_SITE_OTHER): Payer: Commercial Managed Care - PPO | Admitting: Student

## 2022-05-07 VITALS — BP 139/92 | HR 78 | Ht 62.0 in | Wt 180.6 lb

## 2022-05-07 DIAGNOSIS — N921 Excessive and frequent menstruation with irregular cycle: Secondary | ICD-10-CM | POA: Diagnosis not present

## 2022-05-07 DIAGNOSIS — Z113 Encounter for screening for infections with a predominantly sexual mode of transmission: Secondary | ICD-10-CM

## 2022-05-07 MED ORDER — NORGESTIMATE-ETH ESTRADIOL 0.25-35 MG-MCG PO TABS: 1.0000 | ORAL_TABLET | Freq: Every day | ORAL | 11 refills | Status: DC

## 2022-05-07 NOTE — Progress Notes (Signed)
  History:  [name] is a 36 y.o. [id number] who presents to clinic today for irregular spotting that she has noticed in between her cycles for several months now. Patient would also like to be checked for BV and STIs. BTL for contraception.    The following portions of the patient's history were reviewed and updated as appropriate: allergies, current medications, family history, past medical history, social history, past surgical history and problem list.  Review of Systems:  Review of Systems  Constitutional:  Negative for chills, fever and weight loss.  Gastrointestinal:  Negative for abdominal pain, constipation, diarrhea, nausea and vomiting.  Genitourinary:  Negative for dysuria, frequency and urgency.  Neurological:  Negative for weakness and headaches.  Psychiatric/Behavioral:  Negative for depression. The patient is not nervous/anxious.      Objective:  Physical Exam BP (!) 139/92   Pulse 78   Ht '5\' 2"'$  (1.575 m)   Wt 180 lb 9.6 oz (81.9 kg)   LMP 05/03/2022   BMI 33.03 kg/m  Physical Exam Vitals and nursing note reviewed. Exam conducted with a chaperone present.  Constitutional:      Appearance: Normal appearance.  Abdominal:     Palpations: Abdomen is soft.  Genitourinary:    General: Normal vulva.     Exam position: Supine.     Vagina: Normal. No vaginal discharge, erythema, bleeding or lesions.     Cervix: No discharge, friability, lesion, erythema or cervical bleeding.     Uterus: Normal. Not tender.      Adnexa: Right adnexa normal and left adnexa normal.       Right: No tenderness.         Left: No tenderness.       Rectum: Normal.  Neurological:     Mental Status: She is alert and oriented to person, place, and time.  Psychiatric:        Mood and Affect: Mood normal.        Thought Content: Thought content normal.        Judgment: Judgment normal.     Labs and Imaging No results found for this or any previous visit (from the past 24  hour(s)).  No results found.  Health Maintenance Due  Topic Date Due   COVID-19 Vaccine (1) Never done   INFLUENZA VACCINE  04/22/2022    Labs, imaging and previous visits in Epic and Care Everywhere reviewed  [place]:  1. Screen for STD (sexually transmitted disease) - Cervicovaginal ancillary only( [facility]) - HIV Antibody (routine testing w rflx) - RPR - Hepatitis B Surface AntiGEN - Hepatitis C Antibody  2. Irregular intermenstrual bleeding - US PELVIS TRANSVAGINAL NON-OB (TV ONLY); Future Meds ordered this encounter  Medications   norgestimate-ethinyl estradiol (ORTHO-CYCLEN) 0.25-35 MG-MCG tablet    Sig: Take 1 tablet by mouth daily.    Dispense:  30 tablet    Refill:  11    Order Specific Question:   Supervising Provider    Answer:   Donnamae Jude [[id number]]    Approximately 20 minutes of total time was spent with this patient on counseling and coordination of care  Johnston Ebbs, NP 05/07/2022 2:52 PM

## 2022-05-07 NOTE — Progress Notes (Signed)
Patient presents to discuss spotting and irregular bleeding for 2 months. Patient denies vaginal discharge, odor, itching, but states she may have BV. Desires pap smear today, mother has hx of irregular cervical cells.

## 2022-05-08 LAB — CERVICOVAGINAL ANCILLARY ONLY
Bacterial Vaginitis (gardnerella): POSITIVE — AB
Candida Glabrata: NEGATIVE
Candida Vaginitis: NEGATIVE
Chlamydia: NEGATIVE
Comment: NEGATIVE
Comment: NEGATIVE
Comment: NEGATIVE
Comment: NEGATIVE
Comment: NEGATIVE
Comment: NORMAL
Neisseria Gonorrhea: NEGATIVE
Trichomonas: NEGATIVE

## 2022-05-12 ENCOUNTER — Other Ambulatory Visit: Payer: Self-pay | Admitting: Emergency Medicine

## 2022-05-12 DIAGNOSIS — N760 Acute vaginitis: Secondary | ICD-10-CM

## 2022-05-12 MED ORDER — METRONIDAZOLE 500 MG PO TABS: 500.0000 mg | ORAL_TABLET | Freq: Two times a day (BID) | ORAL | 0 refills | Status: DC

## 2022-05-12 NOTE — Progress Notes (Signed)
Rx for BV

## 2022-05-21 ENCOUNTER — Ambulatory Visit: Payer: Commercial Managed Care - PPO

## 2022-06-19 ENCOUNTER — Ambulatory Visit: Payer: Commercial Managed Care - PPO | Admitting: Certified Nurse Midwife

## 2022-06-19 DIAGNOSIS — Z01419 Encounter for gynecological examination (general) (routine) without abnormal findings: Secondary | ICD-10-CM

## 2022-08-29 ENCOUNTER — Other Ambulatory Visit (HOSPITAL_COMMUNITY)
Admission: RE | Admit: 2022-08-29 | Discharge: 2022-08-29 | Disposition: A | Discharge status: Home / Self Care | Payer: Commercial Managed Care - PPO | Source: Ambulatory Visit | Attending: Obstetrics and Gynecology | Admitting: Obstetrics and Gynecology

## 2022-08-29 ENCOUNTER — Ambulatory Visit (INDEPENDENT_AMBULATORY_CARE_PROVIDER_SITE_OTHER): Payer: Commercial Managed Care - PPO | Admitting: General Practice

## 2022-08-29 VITALS — BP 146/92 | HR 76 | Ht 62.0 in | Wt 183.3 lb

## 2022-08-29 DIAGNOSIS — Z113 Encounter for screening for infections with a predominantly sexual mode of transmission: Secondary | ICD-10-CM | POA: Insufficient documentation

## 2022-08-29 DIAGNOSIS — N898 Other specified noninflammatory disorders of vagina: Secondary | ICD-10-CM

## 2022-08-29 NOTE — Progress Notes (Signed)
SUBJECTIVE:  36 y.o. female complains of vaginal irritation for 1 week(s). Denies abnormal vaginal bleeding or significant pelvic pain or fever. No UTI symptoms. Pt has concerns about exposure to STD.  Patient's last menstrual period was 08/15/2022.  OBJECTIVE:  She appears well, afebrile. Urine dipstick: not done.  ASSESSMENT:  Vaginal Discharge  Vaginal Odor   PLAN:  GC, chlamydia, trichomonas, BVAG, CVAG probe sent to lab. Treatment: To be determined once lab results are received ROV prn if symptoms persist or worsen.

## 2022-08-30 LAB — HEPATITIS B SURFACE ANTIGEN: Hepatitis B Surface Ag: NEGATIVE

## 2022-08-30 LAB — HIV ANTIBODY (ROUTINE TESTING W REFLEX): HIV Screen 4th Generation wRfx: NONREACTIVE

## 2022-08-30 LAB — RPR: RPR Ser Ql: NONREACTIVE

## 2022-08-30 LAB — HEPATITIS C ANTIBODY: Hep C Virus Ab: NONREACTIVE

## 2022-09-01 ENCOUNTER — Encounter: Payer: Self-pay | Admitting: Emergency Medicine

## 2022-09-01 ENCOUNTER — Other Ambulatory Visit: Payer: Self-pay | Admitting: Emergency Medicine

## 2022-09-01 DIAGNOSIS — B9689 Other specified bacterial agents as the cause of diseases classified elsewhere: Secondary | ICD-10-CM

## 2022-09-01 LAB — CERVICOVAGINAL ANCILLARY ONLY
Bacterial Vaginitis (gardnerella): POSITIVE — AB
Candida Glabrata: NEGATIVE
Candida Vaginitis: NEGATIVE
Chlamydia: NEGATIVE
Comment: NEGATIVE
Comment: NEGATIVE
Comment: NEGATIVE
Comment: NEGATIVE
Comment: NEGATIVE
Comment: NORMAL
Neisseria Gonorrhea: NEGATIVE
Trichomonas: NEGATIVE

## 2022-09-01 MED ORDER — METRONIDAZOLE 500 MG PO TABS: 500.0000 mg | ORAL_TABLET | Freq: Two times a day (BID) | ORAL | 0 refills | Status: DC

## 2022-09-01 NOTE — Progress Notes (Signed)
Rx for BV sent per protocol.

## 2022-10-24 ENCOUNTER — Other Ambulatory Visit (HOSPITAL_COMMUNITY)
Admission: RE | Admit: 2022-10-24 | Discharge: 2022-10-24 | Disposition: A | Discharge status: Home / Self Care | Payer: Commercial Managed Care - PPO | Source: Ambulatory Visit | Attending: Obstetrics and Gynecology | Admitting: Obstetrics and Gynecology

## 2022-10-24 ENCOUNTER — Ambulatory Visit (INDEPENDENT_AMBULATORY_CARE_PROVIDER_SITE_OTHER): Payer: Commercial Managed Care - PPO

## 2022-10-24 VITALS — BP 141/108 | HR 64

## 2022-10-24 DIAGNOSIS — N898 Other specified noninflammatory disorders of vagina: Secondary | ICD-10-CM | POA: Diagnosis present

## 2022-10-24 DIAGNOSIS — B9689 Other specified bacterial agents as the cause of diseases classified elsewhere: Secondary | ICD-10-CM | POA: Diagnosis present

## 2022-10-24 DIAGNOSIS — N760 Acute vaginitis: Secondary | ICD-10-CM | POA: Insufficient documentation

## 2022-10-24 DIAGNOSIS — Z113 Encounter for screening for infections with a predominantly sexual mode of transmission: Secondary | ICD-10-CM

## 2022-10-24 MED ORDER — METRONIDAZOLE 500 MG PO TABS: 500.0000 mg | ORAL_TABLET | Freq: Two times a day (BID) | ORAL | 0 refills | Status: AC

## 2022-10-24 NOTE — Progress Notes (Signed)
SUBJECTIVE:  37 y.o. female complains of  vaginal discharge consistent with bacterial vaginitis for several days. Denies abnormal vaginal bleeding or significant pelvic pain or fever. No UTI symptoms. Denies history of known exposure to STD but does request STI blood work panel.   No LMP recorded.  OBJECTIVE:  She appears well, afebrile. Urine dipstick: not done.  ASSESSMENT:  Vaginal Discharge  Screening for STI's   PLAN:  GC, chlamydia, trichomonas, BVAG, CVAG probe sent to lab. STI blood work panel collected by lab. Treatment: Flagyl '500mg'$  sent [facility] in [place], [place] ROV prn if symptoms persist or worsen. Follow up with PCP regarding hypertensive blood pressure readings.

## 2022-10-25 LAB — RPR: RPR Ser Ql: NONREACTIVE

## 2022-10-25 LAB — HIV ANTIBODY (ROUTINE TESTING W REFLEX): HIV Screen 4th Generation wRfx: NONREACTIVE

## 2022-10-25 LAB — HEPATITIS C ANTIBODY: Hep C Virus Ab: NONREACTIVE

## 2022-10-25 LAB — HEPATITIS B SURFACE ANTIGEN: Hepatitis B Surface Ag: NEGATIVE

## 2022-10-27 LAB — CERVICOVAGINAL ANCILLARY ONLY
Bacterial Vaginitis (gardnerella): POSITIVE — AB
Candida Glabrata: NEGATIVE
Candida Vaginitis: NEGATIVE
Chlamydia: NEGATIVE
Comment: NEGATIVE
Comment: NEGATIVE
Comment: NEGATIVE
Comment: NEGATIVE
Comment: NEGATIVE
Comment: NORMAL
Neisseria Gonorrhea: NEGATIVE
Trichomonas: NEGATIVE

## 2023-08-10 ENCOUNTER — Encounter: Payer: Self-pay | Admitting: Family Medicine

## 2023-08-10 ENCOUNTER — Ambulatory Visit: Payer: 59 | Admitting: Family Medicine

## 2023-08-10 ENCOUNTER — Other Ambulatory Visit (HOSPITAL_COMMUNITY)
Admission: RE | Admit: 2023-08-10 | Discharge: 2023-08-10 | Disposition: A | Discharge status: Home / Self Care | Payer: 59 | Source: Ambulatory Visit | Attending: Family Medicine | Admitting: Family Medicine

## 2023-08-10 VITALS — BP 146/84 | HR 56 | Ht 62.0 in | Wt 187.0 lb

## 2023-08-10 DIAGNOSIS — Z01419 Encounter for gynecological examination (general) (routine) without abnormal findings: Secondary | ICD-10-CM | POA: Insufficient documentation

## 2023-08-10 DIAGNOSIS — R109 Unspecified abdominal pain: Secondary | ICD-10-CM

## 2023-08-10 DIAGNOSIS — Z1339 Encounter for screening examination for other mental health and behavioral disorders: Secondary | ICD-10-CM

## 2023-08-10 NOTE — Progress Notes (Signed)
ANNUAL EXAM Patient name: Lydia Blair MRN [id number]  Date of birth: [birth year] Chief Complaint:   Gynecologic Exam  History of Present Illness:   Lydia Blair is a 37 y.o. [id number] African-American female being seen today for a routine annual exam.  Current complaints: Low back pain for approximately 1 month.  Reports that she does have issues with constipation and she takes Epsom salt occasionally for that.  Also reports pain in her left flank that improved with urination.  Reports that she [facility] the [facility] and had swabs done which came back negative but then went to the hospital and had urinalysis showing blood in her urine but received no treatment For her issues.  Patient's last menstrual period was 07/20/2023.   Upstream - 08/10/23 0910       Pregnancy Intention Screening   Does the patient want to become pregnant in the next year? No    Does the patient's partner want to become pregnant in the next year? No    Would the patient like to discuss contraceptive options today? No      Contraception Wrap Up   Current Method Female Condom            The pregnancy intention screening data noted above was reviewed. Potential methods of contraception were discussed. The patient elected to proceed with No data recorded.   Last pap 08/07/2020. Results were: NILM w/ HRHPV negative. H/O abnormal pap: no Last mammogram: Not due Last colonoscopy: Not due     08/10/2023    9:09 AM 08/07/2020    4:17 PM 03/01/2015    2:37 PM  Depression screen PHQ 2/9  Decreased Interest 1 0 3  Down, Depressed, Hopeless 0 0 0  PHQ - 2 Score 1 0 3  Altered sleeping 0  1  Tired, decreased energy 0  1  Change in appetite 0  3  Feeling bad or failure about yourself  0  0  Trouble concentrating 0  0  Moving slowly or fidgety/restless 0  0  Suicidal thoughts 0  0  PHQ-9 Score 1  8  Difficult doing work/chores Not difficult at all  Somewhat difficult        08/10/2023    9:10  AM  GAD 7 : Generalized Anxiety Score  Nervous, Anxious, on Edge 0  Control/stop worrying 0  Worry too much - different things 0  Trouble relaxing 1  Restless 1  Easily annoyed or irritable 0  Afraid - awful might happen 0  Total GAD 7 Score 2  Anxiety Difficulty Not difficult at all     Review of Systems:   Pertinent items are noted in HPI Denies any headaches, blurred vision, fatigue, shortness of breath, chest pain, abdominal pain, abnormal vaginal discharge/itching/odor/irritation, problems with periods, bowel movements, urination, or intercourse unless otherwise stated above. Pertinent History Reviewed:  Reviewed past medical,surgical, social and family history.  Reviewed problem list, medications and allergies. Physical Assessment:   Vitals:   08/10/23 0906 08/10/23 0912  BP: (!) 141/93 (!) 146/84  Pulse: 66 (!) 56  Weight: 187 lb (84.8 kg)   Height: 5\' 2"  (1.575 m)   Body mass index is 34.2 kg/m.        Physical Examination:   General appearance - well appearing, and in no distress  Mental status - alert, oriented to person, place, and time  Psych:  She has a normal mood and affect  Skin - warm and dry, normal  color, no suspicious lesions noted  Chest - effort normal, all lung fields clear to auscultation bilaterally  Heart - normal rate and regular rhythm  Neck:  midline trachea, no thyromegaly or nodules  Breasts - breasts appear normal, no suspicious masses, no skin or nipple changes or  axillary nodes  Abdomen - soft, nontender, nondistended, no masses or organomegaly  Pelvic - VULVA: normal appearing vulva with no masses, tenderness or lesions  VAGINA: normal appearing vagina with normal color and discharge, no lesions  CERVIX: normal appearing cervix without discharge or lesions, no CMT  Thin prep pap is done with HR HPV cotesting  UTERUS: uterus is felt to be normal size, shape, consistency and nontender   ADNEXA: No adnexal masses or tenderness  noted.  Extremities:  No swelling or varicosities noted  Chaperone present for exam  No results found for this or any previous visit (from the past 24 hour(s)).  Assessment & Plan:  1) Well-Woman Exam  2) low back pain Patient with low back pain for the last month.  Differential includes UTI/pyelonephritis, constipation, MSK origin.  Physical exam was reassuring.  Will check urine culture.  Discussed constipation management with the patient.  Described strict return precautions.  Will treat as needed  Labs/procedures today: Urine culture, wet prep, Pap smear  Mammogram: @ 40yo, or sooner if problems Colonoscopy: @ 39yo, or sooner if problems  Orders Placed This Encounter  Procedures   Urine Culture    Meds: No orders of the defined types were placed in this encounter.   Follow-up: No follow-ups on file.  Celedonio Savage, MD 08/10/2023 9:38 AM

## 2023-08-10 NOTE — Progress Notes (Signed)
37 y.o. GYN presents for AEX/PAP.  C/o left flank abdominal pain that radiates to her back.  Pt had UA done 08/05/23, +Blood in urine.

## 2023-08-11 LAB — CERVICOVAGINAL ANCILLARY ONLY
Bacterial Vaginitis (gardnerella): POSITIVE — AB
Candida Glabrata: NEGATIVE
Candida Vaginitis: NEGATIVE
Chlamydia: NEGATIVE
Comment: NEGATIVE
Comment: NEGATIVE
Comment: NEGATIVE
Comment: NEGATIVE
Comment: NEGATIVE
Comment: NORMAL
Neisseria Gonorrhea: NEGATIVE
Trichomonas: NEGATIVE

## 2023-08-11 LAB — CYTOLOGY - PAP
Adequacy: ABSENT
Comment: NEGATIVE
Diagnosis: NEGATIVE
High risk HPV: NEGATIVE

## 2023-08-12 ENCOUNTER — Other Ambulatory Visit: Payer: Self-pay

## 2023-08-12 DIAGNOSIS — N760 Acute vaginitis: Secondary | ICD-10-CM

## 2023-08-12 LAB — URINE CULTURE

## 2023-08-12 MED ORDER — METRONIDAZOLE 500 MG PO TABS: 500.0000 mg | ORAL_TABLET | Freq: Two times a day (BID) | ORAL | 0 refills | Status: DC

## 2023-10-05 ENCOUNTER — Encounter: Payer: Self-pay | Admitting: Emergency Medicine

## 2023-10-05 ENCOUNTER — Ambulatory Visit
Admission: EM | Admit: 2023-10-05 | Discharge: 2023-10-05 | Disposition: A | Discharge status: Home / Self Care | Payer: 59 | Attending: Emergency Medicine | Admitting: Emergency Medicine

## 2023-10-05 DIAGNOSIS — Z789 Other specified health status: Secondary | ICD-10-CM | POA: Diagnosis not present

## 2023-10-05 DIAGNOSIS — Z113 Encounter for screening for infections with a predominantly sexual mode of transmission: Secondary | ICD-10-CM | POA: Diagnosis present

## 2023-10-05 DIAGNOSIS — N760 Acute vaginitis: Secondary | ICD-10-CM | POA: Insufficient documentation

## 2023-10-05 LAB — POCT URINALYSIS DIP (MANUAL ENTRY)
Bilirubin, UA: NEGATIVE
Glucose, UA: NEGATIVE mg/dL
Ketones, POC UA: NEGATIVE mg/dL
Leukocytes, UA: NEGATIVE
Nitrite, UA: NEGATIVE
Protein Ur, POC: NEGATIVE mg/dL
Spec Grav, UA: 1.025
Urobilinogen, UA: 0.2 U/dL
pH, UA: 6

## 2023-10-05 LAB — POCT URINE PREGNANCY: Preg Test, Ur: NEGATIVE

## 2023-10-05 NOTE — Discharge Instructions (Signed)
 The results of your vaginal swab test which screens for BV, yeast, gonorrhea, chlamydia and trichomonas will be posted to your MyChart account once it is complete.  This typically takes 2 to 4 days.  Please abstain from sexual intercourse of any kind, vaginal, oral or anal, until you have received the results of your STD testing.      If any of your results are abnormal, you will receive a phone call regarding treatment.  Prescriptions, if any are needed, will be provided for you at your pharmacy.      Your urine pregnancy test today is negative.     Our point-of-care analysis of your urine sample today was normal and did not reveal any concern for urinary tract infection.     If you have not had complete resolution of your symptoms after completing any recommended treatment or if your symptoms worsen, please return for repeat evaluation.     Thank you for visiting [facility] today.  We appreciate the opportunity to participate in your care.

## 2023-10-05 NOTE — ED Provider Notes (Addendum)
 [facility]    CSN: [id number] Arrival date & time: 10/05/23  1631    HISTORY   Chief Complaint  Patient presents with   Vaginal Discharge   HPI Lydia Blair is a pleasant, 38 y.o. female who presents to urgent care today. HPI Patient endorses abnormal vaginal discharge and abnormal vaginal odor.   Patient denies perineal pain, rectal pain, pain with defecation, genital lesion , burning during urination, fever , body aches, chills, rigors, malaise, and significant fatigue.  Past Medical History:  Diagnosis Date   Cardiomyopathy [facility]) 2014   Dermoid cyst of left orbit    Hypertension    Prehypertension    Patient Active Problem List   Diagnosis Date Noted   Screening examination for STD (sexually transmitted disease) 10/05/2023   Hx of cardiomyopathy 09/26/2015   Essential hypertension    Heart palpitations    Cellulitis 09/24/2015   Cardiomyopathy (HCC) 05/02/2015   Palpitation 05/02/2015   SOB (shortness of breath) 05/02/2015   BV (bacterial vaginosis) 02/08/2014   Vaginitis and vulvovaginitis 06/02/2013   Past Surgical History:  Procedure Laterality Date   CESAREAN SECTION     INCISE AND DRAIN ABCESS     WISDOM TOOTH EXTRACTION     OB History     Gravida  6   Para  4   Term  3   Preterm      AB  2   Living  4      SAB      IAB  2   Ectopic      Multiple      Live Births  3          Home Medications    Prior to Admission medications   Medication Sig Start Date End Date Taking? Authorizing Provider  albuterol  (PROVENTIL  HFA;VENTOLIN  HFA) 108 (90 BASE) MCG/ACT inhaler Inhale 2 puffs into the lungs every 6 (six) hours as needed for wheezing or shortness of breath. Patient not taking: Reported on 10/09/2015 05/16/15   Marguerite Caddy A, CNM  amLODipine  (NORVASC ) 2.5 MG tablet Take 1 tablet (2.5 mg total) by mouth daily. Patient not taking: Reported on 05/07/2022 11/08/20   Stuart Vernell Norris, PA-C  clobetasol  cream  (TEMOVATE ) 0.05 % Apply 1 application topically 2 (two) times daily. Patient not taking: Reported on 08/29/2022 08/22/21   Teresa Shelba SAUNDERS, NP  lidocaine  (XYLOCAINE ) 2 % solution Use as directed 15 mLs in the mouth or throat as needed for mouth pain. Patient not taking: Reported on 05/07/2022 08/22/21   Teresa Shelba SAUNDERS, NP  metroNIDAZOLE  (FLAGYL ) 500 MG tablet Take 1 tablet (500 mg total) by mouth 2 (two) times daily. 08/12/23   Zina Jerilynn LABOR, MD  Multiple Vitamins-Minerals (MULTIVITAMIN WITH MINERALS) tablet Take 1 tablet by mouth daily. Patient not taking: Reported on 05/07/2022    [provider]  norgestimate -ethinyl estradiol  (ORTHO-CYCLEN) 0.25-35 MG-MCG tablet Take 1 tablet by mouth daily. Patient not taking: Reported on 08/29/2022 05/07/22   Forlan, Nicole, NP  omeprazole  (PRILOSEC) 20 MG capsule Take 1 capsule (20 mg total) by mouth 2 (two) times daily before a meal. Patient not taking: Reported on 08/07/2020 02/05/17   Loetta Senior, MD    Family History Family History  Problem Relation Age of Onset   Diabetes Maternal Grandmother    Hypertension Maternal Grandmother    Hypertension Maternal Grandfather    Hypertension Paternal Grandfather    HIV Father    Hypertension Sister    Social  History Social History   Tobacco Use   Smoking status: Former    Current packs/day: 0.00    Types: Cigarettes    Quit date: 01/29/2015    Years since quitting: 8.6   Smokeless tobacco: Never  Substance Use Topics   Alcohol use: Yes    Alcohol/week: 0.0 standard drinks of alcohol    Comment: occ   Drug use: No   Allergies   Vicodin [hydrocodone-acetaminophen ]  Review of Systems Review of Systems Pertinent findings revealed after performing a 14 point review of systems has been noted in the history of present illness.  Physical Exam Vital Signs BP (!) 147/101 (BP Location: Right Arm)   Pulse 73   Temp 98.6 F (37 C) (Oral)   Resp 16   LMP 09/12/2023 (Approximate)    SpO2 98%   No data found.  Physical Exam Vitals and nursing note reviewed.  Constitutional:      General: She is not in acute distress.    Appearance: Normal appearance. She is not ill-appearing.  HENT:     Head: Normocephalic and atraumatic.  Eyes:     General: Lids are normal.        Right eye: No discharge.        Left eye: No discharge.     Extraocular Movements: Extraocular movements intact.     Conjunctiva/sclera: Conjunctivae normal.     Right eye: Right conjunctiva is not injected.     Left eye: Left conjunctiva is not injected.  Neck:     Trachea: Trachea and phonation normal.  Cardiovascular:     Rate and Rhythm: Normal rate and regular rhythm.     Pulses: Normal pulses.     Heart sounds: Normal heart sounds. No murmur heard.    No friction rub. No gallop.  Pulmonary:     Effort: Pulmonary effort is normal. No accessory muscle usage, prolonged expiration or respiratory distress.     Breath sounds: Normal breath sounds. No stridor, decreased air movement or transmitted upper airway sounds. No decreased breath sounds, wheezing, rhonchi or rales.  Chest:     Chest wall: No tenderness.  Genitourinary:    Comments: Patient politely declines pelvic exam today, patient provided a vaginal swab for testing. Musculoskeletal:        General: Normal range of motion.     Cervical back: Normal range of motion and neck supple. Normal range of motion.  Lymphadenopathy:     Cervical: No cervical adenopathy.  Skin:    General: Skin is warm and dry.     Findings: No erythema or rash.  Neurological:     General: No focal deficit present.     Mental Status: She is alert and oriented to person, place, and time.  Psychiatric:        Mood and Affect: Mood normal.        Behavior: Behavior normal.     Visual Acuity Right Eye Distance:   Left Eye Distance:   Bilateral Distance:    Right Eye Near:   Left Eye Near:    Bilateral Near:     UC Couse / Diagnostics / Procedures:      Radiology No results found.  Procedures Procedures (including critical care time) EKG  Pending results:  Labs Reviewed  POCT URINALYSIS DIP (MANUAL ENTRY) - Abnormal; Notable for the following components:      Result Value   Blood, UA trace-intact (*)    All other components within normal limits  POCT URINE PREGNANCY - Normal  RPR  HIV ANTIBODY (ROUTINE TESTING W REFLEX)  CERVICOVAGINAL ANCILLARY ONLY    Medications Ordered in UC: Medications - No data to display  UC Diagnoses / Final Clinical Impressions(s)   I have reviewed the triage vital signs and the nursing notes.  Pertinent labs & imaging results that were available during my care of the patient were reviewed by me and considered in my medical decision making (see chart for details).    Final diagnoses:  Vaginitis and vulvovaginitis  Screening examination for STD (sexually transmitted disease)  Not currently pregnant   STD screening was performed, patient advised that the results be posted to their MyChart and if any of the results are positive, they will be notified by phone, further treatment will be provided as indicated based on results of STD screening. Patient was advised to abstain from sexual intercourse until that they receive the results of their STD testing.  Patient was also advised to use condoms to protect themselves from STD exposure. Urinalysis today was normal. Urine pregnancy test was negative. Return precautions advised.  Drug allergies reviewed, all questions addressed.   Please see discharge instructions below for details of plan of care as provided to patient. ED Prescriptions   None    PDMP not reviewed this encounter.  Disposition Upon Discharge:  Condition: stable for discharge home  Patient presented with concern for an acute illness with associated systemic symptoms and significant discomfort requiring urgent management. In my opinion, this is a condition that a prudent lay  person (someone who possesses an average knowledge of health and medicine) may potentially expect to result in complications if not addressed urgently such as respiratory distress, impairment of bodily function or dysfunction of bodily organs.   As such, the patient has been evaluated and assessed, work-up was performed and treatment was provided in alignment with urgent care protocols and evidence based medicine.  Patient/parent/caregiver has been advised that the patient may require follow up for further testing and/or treatment if the symptoms continue in spite of treatment, as clinically indicated and appropriate.  Routine symptom specific, illness specific and/or disease specific instructions were discussed with the patient and/or caregiver at length.  Prevention strategies for avoiding STD exposure were also discussed.  The patient will follow up with their current PCP if and as advised. If the patient does not currently have a PCP we will assist them in obtaining one.   The patient may need specialty follow up if the symptoms continue, in spite of conservative treatment and management, for further workup, evaluation, consultation and treatment as clinically indicated and appropriate.  Patient/parent/caregiver verbalized understanding and agreement of plan as discussed.  All questions were addressed during visit.  Please see discharge instructions below for further details of plan.    Discharge Instructions      The results of your vaginal swab test which screens for BV, yeast, gonorrhea, chlamydia and trichomonas will be posted to your MyChart account once it is complete.  This typically takes 2 to 4 days.  Please abstain from sexual intercourse of any kind, vaginal, oral or anal, until you have received the results of your STD testing.      If any of your results are abnormal, you will receive a phone call regarding treatment.  Prescriptions, if any are needed, will be provided for you at  your pharmacy.      Your urine pregnancy test today is negative.     Our point-of-care  analysis of your urine sample today was normal and did not reveal any concern for urinary tract infection.     If you have not had complete resolution of your symptoms after completing any recommended treatment or if your symptoms worsen, please return for repeat evaluation.     Thank you for visiting [facility] today.  We appreciate the opportunity to participate in your care.       This office note has been dictated using Teaching laboratory technician.  Unfortunately, this method of dictation can sometimes lead to typographical or grammatical errors.  I apologize for your inconvenience in advance if this occurs.  Please do not hesitate to reach out to me if clarification is needed.       Joesph Shaver Scales, PA-C 10/05/23 1753    Joesph Shaver Scales, PA-C 10/05/23 1756

## 2023-10-05 NOTE — ED Triage Notes (Signed)
 Pt reports vaginal discharge, odor for weeks. Reports had some abd pains and no BM in about 3-4 days. Patient eating chips during triage without difficulty. Takes probiotic daily.

## 2023-10-06 LAB — HIV ANTIBODY (ROUTINE TESTING W REFLEX): HIV Screen 4th Generation wRfx: NONREACTIVE

## 2023-10-06 LAB — RPR: RPR Ser Ql: NONREACTIVE

## 2023-10-07 ENCOUNTER — Telehealth (HOSPITAL_COMMUNITY): Payer: Self-pay

## 2023-10-07 LAB — CERVICOVAGINAL ANCILLARY ONLY
Bacterial Vaginitis (gardnerella): POSITIVE — AB
Candida Glabrata: NEGATIVE
Candida Vaginitis: POSITIVE — AB
Chlamydia: NEGATIVE
Comment: NEGATIVE
Comment: NEGATIVE
Comment: NEGATIVE
Comment: NEGATIVE
Comment: NEGATIVE
Comment: NORMAL
Neisseria Gonorrhea: NEGATIVE
Trichomonas: POSITIVE — AB

## 2023-10-07 MED ORDER — FLUCONAZOLE 150 MG PO TABS: 150.0000 mg | ORAL_TABLET | Freq: Once | ORAL | 0 refills | Status: AC

## 2023-10-07 MED ORDER — METRONIDAZOLE 500 MG PO TABS: 500.0000 mg | ORAL_TABLET | Freq: Two times a day (BID) | ORAL | 0 refills | Status: AC

## 2023-10-07 NOTE — Telephone Encounter (Signed)
 Per protocol, pt requires tx with metronidazole. Reviewed with patient, verified pharmacy, prescription sent.

## 2023-11-21 ENCOUNTER — Ambulatory Visit
Admission: EM | Admit: 2023-11-21 | Discharge: 2023-11-21 | Disposition: A | Discharge status: Home / Self Care | Attending: Emergency Medicine | Admitting: Emergency Medicine

## 2023-11-21 DIAGNOSIS — I10 Essential (primary) hypertension: Secondary | ICD-10-CM | POA: Insufficient documentation

## 2023-11-21 DIAGNOSIS — N760 Acute vaginitis: Secondary | ICD-10-CM | POA: Diagnosis not present

## 2023-11-21 LAB — POCT URINALYSIS DIP (MANUAL ENTRY)
Bilirubin, UA: NEGATIVE
Glucose, UA: NEGATIVE mg/dL
Ketones, POC UA: NEGATIVE mg/dL
Leukocytes, UA: NEGATIVE
Nitrite, UA: NEGATIVE
Protein Ur, POC: NEGATIVE mg/dL
Spec Grav, UA: <=1.005 — AB (ref 1.010–1.025)
Urobilinogen, UA: 0.2 U/dL
pH, UA: 6 (ref 5.0–8.0)

## 2023-11-21 LAB — POCT URINE PREGNANCY: Preg Test, Ur: NEGATIVE

## 2023-11-21 MED ORDER — METRONIDAZOLE 0.75 % VA GEL: VAGINAL | 0 refills | Status: AC

## 2023-11-21 MED ORDER — METRONIDAZOLE 500 MG PO TABS: 500.0000 mg | ORAL_TABLET | Freq: Two times a day (BID) | ORAL | 0 refills | Status: AC

## 2023-11-21 MED ORDER — AMLODIPINE BESYLATE 2.5 MG PO TABS: 2.5000 mg | ORAL_TABLET | Freq: Every day | ORAL | 2 refills | Status: AC

## 2023-11-21 MED ORDER — HYDROCHLOROTHIAZIDE 12.5 MG PO TABS: 12.5000 mg | ORAL_TABLET | Freq: Every morning | ORAL | 2 refills | Status: AC

## 2023-11-21 NOTE — ED Provider Notes (Signed)
 Lydia Blair    CSN: [id number] Arrival date & time: 11/21/23  1035    HISTORY   Chief Complaint  Patient presents with   Vaginal Discharge   Headache   HPI Lydia Blair is a pleasant, 38 y.o. female who presents to urgent care today. Patient complains of vaginal discomfort.  Patient endorses abnormal vaginal discharge, abnormal vaginal odor, vaginal irritation, and incontinence of urine.  Patient endorses history of frequent episodes of BV, states current symptoms have been present for 2 and half weeks.  Patient states she has tried over-the-counter boric acid treatments but has only used them for a few days at a time, states she was unaware that they needed to be used longer to be effective.  Patient also complains of lower back pain on both sides.  Patient denies rash, suprapubic pain, perineal pain, burning during urination, increased frequency of urination, sensation of incomplete emptying, flank pain, fever , body aches, chills, rigors, malaise, significant fatigue, and concern for STD exposure.   Patient reports a history of hypertension, has been prescribed lisinopril but not taking daily, states she does not like to take medicine.  Patient denies chest pain, shortness of breath, vision changes.  Patient endorses left-sided headache at this time, states she can feel her pulse in her left eye, denies frank eye pain.  Patient denies history of migraines but states she gets this type of headache every now and then.  States has not taken anything to treat her headache because, again, patient does not like to take medications.  The history is provided by the patient.    Past Medical History:  Diagnosis Date   Cardiomyopathy [facility]) 2014   Dermoid cyst of left orbit    Hypertension    Prehypertension    Patient Active Problem List   Diagnosis Date Noted   Screening examination for STD (sexually transmitted disease) 10/05/2023   Hx of cardiomyopathy 09/26/2015   Essential  hypertension    Heart palpitations    Cellulitis 09/24/2015   Cardiomyopathy (HCC) 05/02/2015   Palpitation 05/02/2015   SOB (shortness of breath) 05/02/2015   BV (bacterial vaginosis) 02/08/2014   Vaginitis and vulvovaginitis 06/02/2013   Past Surgical History:  Procedure Laterality Date   CESAREAN SECTION     INCISE AND DRAIN ABCESS     WISDOM TOOTH EXTRACTION     OB History     Gravida  6   Para  4   Term  3   Preterm      AB  2   Living  4      SAB      IAB  2   Ectopic      Multiple      Live Births  3          Home Medications    Prior to Admission medications   Not on File    Family History Family History  Problem Relation Age of Onset   Diabetes Maternal Grandmother    Hypertension Maternal Grandmother    Hypertension Maternal Grandfather    Hypertension Paternal Grandfather    HIV Father    Hypertension Sister    Social History Social History   Tobacco Use   Smoking status: Former    Current packs/day: 0.00    Types: Cigarettes    Quit date: 01/29/2015    Years since quitting: 8.8   Smokeless tobacco: Never  Substance Use Topics   Alcohol use: Yes  Alcohol/week: 0.0 standard drinks of alcohol    Comment: occ   Drug use: No   Allergies   Vicodin [hydrocodone-acetaminophen]  Review of Systems Review of Systems Pertinent findings revealed after performing a 14 point review of systems has been noted in the history of present illness.  Physical Exam Vital Signs BP (!) 134/90 (BP Location: Right Arm)   Pulse 72   Temp 98.1 F (36.7 C) (Oral)   Resp 18   LMP 11/01/2023 (Approximate)   SpO2 96%   No data found.  Physical Exam Vitals and nursing note reviewed.  Constitutional:      General: She is not in acute distress.    Appearance: Normal appearance. She is not ill-appearing.  HENT:     Head: Normocephalic and atraumatic.  Eyes:     General: Lids are normal.        Right eye: No discharge.        Left eye: No  discharge.     Extraocular Movements: Extraocular movements intact.     Conjunctiva/sclera: Conjunctivae normal.     Right eye: Right conjunctiva is not injected.     Left eye: Left conjunctiva is not injected.     Pupils: Pupils are equal, round, and reactive to light.  Neck:     Trachea: Trachea and phonation normal.  Cardiovascular:     Rate and Rhythm: Normal rate and regular rhythm.     Pulses: Normal pulses.     Heart sounds: Normal heart sounds, S1 normal and S2 normal. No murmur heard.    No friction rub. No gallop.  Pulmonary:     Effort: Pulmonary effort is normal. No tachypnea, bradypnea, accessory muscle usage, prolonged expiration or respiratory distress.     Breath sounds: Normal breath sounds and air entry. No stridor, decreased air movement or transmitted upper airway sounds. No decreased breath sounds, wheezing, rhonchi or rales.  Chest:     Chest wall: No tenderness.  Genitourinary:    Comments: Patient politely declines pelvic exam today, patient provided a vaginal swab for testing. Musculoskeletal:        General: Normal range of motion.     Cervical back: Normal range of motion and neck supple. Normal range of motion.     Right lower leg: No edema.     Left lower leg: No edema.  Lymphadenopathy:     Cervical: No cervical adenopathy.  Skin:    General: Skin is warm and dry.     Findings: No erythema or rash.  Neurological:     General: No focal deficit present.     Mental Status: She is alert and oriented to person, place, and time.     Cranial Nerves: Cranial nerves 2-12 are intact.     Sensory: Sensation is intact.     Motor: Motor function is intact.  Psychiatric:        Mood and Affect: Mood normal.        Behavior: Behavior normal.     Visual Acuity Right Eye Distance:   Left Eye Distance:   Bilateral Distance:    Right Eye Near:   Left Eye Near:    Bilateral Near:     Blair Couse / Diagnostics / Procedures:     Radiology No results  found.  Procedures Procedures (including critical care time) EKG  Pending results:  Labs Reviewed  POCT URINALYSIS DIP (MANUAL ENTRY) - Abnormal; Notable for the following components:      Result Value  Color, UA light yellow (*)    Spec Grav, UA <=1.005 (*)    Blood, UA trace-intact (*)    All other components within normal limits  POCT URINE PREGNANCY  CERVICOVAGINAL ANCILLARY ONLY    Medications Ordered in Blair: Medications - No data to display  Blair Diagnoses / Final Clinical Impressions(s)   I have reviewed the triage vital signs and the nursing notes.  Pertinent labs & imaging results that were available during my care of the patient were reviewed by me and considered in my medical decision making (see chart for details).    Final diagnoses:  Essential hypertension  Vaginitis and vulvovaginitis  Patient is a African-American, recommend discontinue lisinopril and begin amlodipine and hydrochlorothiazide for treatment of hypertension.  Patient provided with education regarding the importance of daily antihypertensive medications. Patient was provided with empiric treatment for frequently recurring BV: Metronidazole 500 mg twice daily for 7 days and Metronidazole 0.75% gel inserted daily at bedtime for 5 days based on the history provided to me today. STD screening was performed, patient advised that the results be posted to their MyChart and if any of the results are positive, they will be notified by phone, further treatment will be provided as indicated based on results of STD screening. Patient was advised to abstain from sexual intercourse until that they receive the results of their STD testing.  Patient was also advised to use condoms to protect themselves from STD exposure. Urinalysis today was normal. Urine pregnancy test was negative. Return precautions advised.  Drug allergies reviewed, all questions addressed.   Please see discharge instructions below for details of  plan of care as provided to patient. ED Prescriptions     Medication Sig Dispense Auth. Provider   metroNIDAZOLE (FLAGYL) 500 MG tablet Take 1 tablet (500 mg total) by mouth 2 (two) times daily for 7 days. 14 tablet Theadora Rama Scales, PA-C   amLODipine (NORVASC) 2.5 MG tablet Take 1 tablet (2.5 mg total) by mouth daily. 30 tablet Theadora Rama Scales, PA-C   hydrochlorothiazide (HYDRODIURIL) 12.5 MG tablet Take 1 tablet (12.5 mg total) by mouth in the morning. 30 tablet Theadora Rama Scales, PA-C   metroNIDAZOLE (METROGEL) 0.75 % vaginal gel Insert vaginally using applicator nightly for 5 nights.  Abstain from sexual intercourse or tampon use until treatment is complete. 70 g Theadora Rama Scales, PA-C      PDMP not reviewed this encounter.  Disposition Upon Discharge:  Condition: stable for discharge home  Patient presented with concern for an acute illness with associated systemic symptoms and significant discomfort requiring urgent management. In my opinion, this is a condition that a prudent lay person (someone who possesses an average knowledge of health and medicine) may potentially expect to result in complications if not addressed urgently such as respiratory distress, impairment of bodily function or dysfunction of bodily organs.   As such, the patient has been evaluated and assessed, work-up was performed and treatment was provided in alignment with urgent care protocols and evidence based medicine.  Patient/parent/caregiver has been advised that the patient may require follow up for further testing and/or treatment if the symptoms continue in spite of treatment, as clinically indicated and appropriate.  Routine symptom specific, illness specific and/or disease specific instructions were discussed with the patient and/or caregiver at length.  Prevention strategies for avoiding STD exposure were also discussed.  The patient will follow up with their current PCP if and as  advised. If the patient does not currently  have a PCP we will assist them in obtaining one.   The patient may need specialty follow up if the symptoms continue, in spite of conservative treatment and management, for further workup, evaluation, consultation and treatment as clinically indicated and appropriate.  Patient/parent/caregiver verbalized understanding and agreement of plan as discussed.  All questions were addressed during visit.  Please see discharge instructions below for further details of plan.    Discharge Instructions      Hypertension is commonly known as "the silent killer".  This is because most people feel just fine until they don't and things can often change very quickly without warning.  Prolonged hypertension that is incompletely treated can lead to even worse outcomes such as heart attack, heart failure, stroke, kidney failure, blindness, erectile dysfunction.   Take care of your heart, your kidneys, your vision and your quality of life by taking care of your blood pressure every single day.  Instead of taking lisinopril, I recommend that you begin amlodipine and hydrochlorothiazide for treatment of your hypertension.  I know this sounds like him adding more medicine but the beauty of taking these 2 medications for hypertension is that they work together like peanut butter and jelly.    Amlodipine relaxes the smooth muscle that squeezes your arteries, allowing blood to flow more freely through your vessels and this reduces your blood pressure.  Hydrochlorothiazide helps your kidneys eliminate more fluid throughout the day, lower blood volume reduces blood pressure.  Please take 1 tablet of each every morning with or without food.  Both of these medications are the lowest dose available and should be enough to help you reach your blood pressure goal of 130/80.  The results of your vaginal swab test which screens for BV, yeast, gonorrhea, chlamydia and trichomonas will be  posted to your MyChart account once it is complete.  This typically takes 2 to 4 days.  Please abstain from sexual intercourse of any kind, vaginal, oral or anal, until you have received the results of your STD testing.      If any of your results are abnormal, you will receive a phone call regarding treatment.  Prescriptions, if any are needed, will be provided for you at your pharmacy.      Based on the symptoms and concerns you shared with me today, you we will be treated for presumed bacterial vaginosis with metronidazole 500 mg twice daily for 7 days and metronidazole vaginal gel 1 applicatorful each night for 5 days.  As we discussed, boric acid is very useful in treating as well as preventing bacterial vaginosis.  Any brand of boric acid works well as long as it is a 600 mg vaginal suppository.  I recommend inserting 1 vaginal suppository nightly for 3 weeks for treatment and, if using preventatively, insert 1 vaginal suppository at night 2-3 times weekly.  Your urine pregnancy test today is negative.  Our point-of-care analysis of your urine sample today was normal and did not reveal any concern for urinary tract infection.     If you have not had complete resolution of your symptoms after completing any recommended treatment or if your symptoms worsen, please return for repeat evaluation.     Thank you for visiting [facility] today.  We appreciate the opportunity to participate in your care.       This office note has been dictated using Teaching laboratory technician.  Unfortunately, this method of dictation can sometimes lead to typographical or grammatical  errors.  I apologize for your inconvenience in advance if this occurs.  Please do not hesitate to reach out to me if clarification is needed.       Theadora Rama Scales, PA-C 11/21/23 1122

## 2023-11-21 NOTE — Discharge Instructions (Signed)
 Hypertension is commonly known as "the silent killer".  This is because most people feel just fine until they don't and things can often change very quickly without warning.  Prolonged hypertension that is incompletely treated can lead to even worse outcomes such as heart attack, heart failure, stroke, kidney failure, blindness, erectile dysfunction.   Take care of your heart, your kidneys, your vision and your quality of life by taking care of your blood pressure every single day.  Instead of taking lisinopril, I recommend that you begin amlodipine and hydrochlorothiazide for treatment of your hypertension.  I know this sounds like him adding more medicine but the beauty of taking these 2 medications for hypertension is that they work together like peanut butter and jelly.    Amlodipine relaxes the smooth muscle that squeezes your arteries, allowing blood to flow more freely through your vessels and this reduces your blood pressure.  Hydrochlorothiazide helps your kidneys eliminate more fluid throughout the day, lower blood volume reduces blood pressure.  Please take 1 tablet of each every morning with or without food.  Both of these medications are the lowest dose available and should be enough to help you reach your blood pressure goal of 130/80.  The results of your vaginal swab test which screens for BV, yeast, gonorrhea, chlamydia and trichomonas will be posted to your MyChart account once it is complete.  This typically takes 2 to 4 days.  Please abstain from sexual intercourse of any kind, vaginal, oral or anal, until you have received the results of your STD testing.      If any of your results are abnormal, you will receive a phone call regarding treatment.  Prescriptions, if any are needed, will be provided for you at your pharmacy.      Based on the symptoms and concerns you shared with me today, you we will be treated for presumed bacterial vaginosis with metronidazole 500 mg twice daily for  7 days and metronidazole vaginal gel 1 applicatorful each night for 5 days.  As we discussed, boric acid is very useful in treating as well as preventing bacterial vaginosis.  Any brand of boric acid works well as long as it is a 600 mg vaginal suppository.  I recommend inserting 1 vaginal suppository nightly for 3 weeks for treatment and, if using preventatively, insert 1 vaginal suppository at night 2-3 times weekly.  Your urine pregnancy test today is negative.  Our point-of-care analysis of your urine sample today was normal and did not reveal any concern for urinary tract infection.     If you have not had complete resolution of your symptoms after completing any recommended treatment or if your symptoms worsen, please return for repeat evaluation.     Thank you for visiting [facility] today.  We appreciate the opportunity to participate in your care.

## 2023-11-21 NOTE — ED Triage Notes (Signed)
 Pt c/o bilateral flank pain, vaginal discharge, malodorous, dryness, itching, and a headache. Pt states he "eyes feels like it is pulsating."   Start Date Headache: 11/19/2023 Genital Symptoms: Two to Three Weeks ago   Home Interventions: None

## 2023-11-23 ENCOUNTER — Telehealth (HOSPITAL_COMMUNITY): Payer: Self-pay

## 2023-11-23 LAB — CERVICOVAGINAL ANCILLARY ONLY
Bacterial Vaginitis (gardnerella): POSITIVE — AB
Candida Glabrata: NEGATIVE
Candida Vaginitis: POSITIVE — AB
Chlamydia: NEGATIVE
Comment: NEGATIVE
Comment: NEGATIVE
Comment: NEGATIVE
Comment: NEGATIVE
Comment: NEGATIVE
Comment: NORMAL
Neisseria Gonorrhea: NEGATIVE
Trichomonas: NEGATIVE

## 2023-11-23 MED ORDER — FLUCONAZOLE 150 MG PO TABS: 150.0000 mg | ORAL_TABLET | Freq: Once | ORAL | 0 refills | Status: AC

## 2023-11-23 NOTE — Telephone Encounter (Signed)
 Per protocol, pt requires tx with Diflucan.  Rx sent to pharmacy on file.

## 2024-03-15 ENCOUNTER — Ambulatory Visit (INDEPENDENT_AMBULATORY_CARE_PROVIDER_SITE_OTHER): Payer: Self-pay

## 2024-03-15 ENCOUNTER — Other Ambulatory Visit

## 2024-03-15 ENCOUNTER — Other Ambulatory Visit (HOSPITAL_COMMUNITY)
Admission: RE | Admit: 2024-03-15 | Discharge: 2024-03-15 | Disposition: A | Discharge status: Home / Self Care | Source: Ambulatory Visit | Attending: Obstetrics and Gynecology | Admitting: Obstetrics and Gynecology

## 2024-03-15 VITALS — BP 157/94

## 2024-03-15 DIAGNOSIS — Z113 Encounter for screening for infections with a predominantly sexual mode of transmission: Secondary | ICD-10-CM

## 2024-03-15 DIAGNOSIS — R300 Dysuria: Secondary | ICD-10-CM | POA: Diagnosis not present

## 2024-03-15 LAB — POCT URINALYSIS DIPSTICK
Bilirubin, UA: NEGATIVE
Glucose, UA: NEGATIVE
Nitrite, UA: NEGATIVE
Protein, UA: NEGATIVE
Spec Grav, UA: 1.02 (ref 1.010–1.025)
Urobilinogen, UA: 0.2 U/dL
pH, UA: 7.5 (ref 5.0–8.0)

## 2024-03-15 NOTE — Progress Notes (Signed)
 SUBJECTIVE:  38 y.o. female who desires a STI screen. Denies abnormal vaginal discharge, bleeding or significant pelvic pain. No UTI symptoms. Denies history of known exposure to STD.  No LMP recorded.  OBJECTIVE:  She appears well.   ASSESSMENT:  STI Screen   PLAN:  Pt offered STI blood screening-requested GC, chlamydia, and trichomonas probe sent to lab.  Treatment: To be determined once lab results are received.  Pt follow up as needed.  SUBJECTIVE: Lydia Blair is a 38 y.o. female who complains of urinary frequency, and dysuria x 3 days, without flank pain, fever, chills, or abnormal vaginal discharge or bleeding.   OBJECTIVE: Appears well, in no apparent distress.  Vital signs are normal. Urine dipstick shows positive for leukocytes, and ketones.    ASSESSMENT: Dysuria  PLAN: Treatment per orders.  Call or return to clinic prn if these symptoms worsen or fail to improve as anticipated.  BP elevated in office today, encouraged pt to monitor BP at home and f/u with PCP as needed

## 2024-03-16 ENCOUNTER — Other Ambulatory Visit: Payer: Self-pay

## 2024-03-16 ENCOUNTER — Ambulatory Visit: Payer: Self-pay | Admitting: Obstetrics and Gynecology

## 2024-03-16 DIAGNOSIS — N3000 Acute cystitis without hematuria: Secondary | ICD-10-CM

## 2024-03-16 DIAGNOSIS — B9689 Other specified bacterial agents as the cause of diseases classified elsewhere: Secondary | ICD-10-CM

## 2024-03-16 LAB — CERVICOVAGINAL ANCILLARY ONLY
Bacterial Vaginitis (gardnerella): POSITIVE — AB
Candida Glabrata: NEGATIVE
Candida Vaginitis: NEGATIVE
Chlamydia: NEGATIVE
Comment: NEGATIVE
Comment: NEGATIVE
Comment: NEGATIVE
Comment: NEGATIVE
Comment: NEGATIVE
Comment: NORMAL
Neisseria Gonorrhea: NEGATIVE
Trichomonas: NEGATIVE

## 2024-03-16 LAB — RPR: RPR Ser Ql: NONREACTIVE

## 2024-03-16 LAB — HEPATITIS C ANTIBODY: Hep C Virus Ab: NONREACTIVE

## 2024-03-16 LAB — HIV ANTIBODY (ROUTINE TESTING W REFLEX): HIV Screen 4th Generation wRfx: NONREACTIVE

## 2024-03-16 LAB — HEPATITIS B SURFACE ANTIGEN: Hepatitis B Surface Ag: NEGATIVE

## 2024-03-16 MED ORDER — NUVESSA 1.3 % VA GEL: 1.0000 | Freq: Every evening | VAGINAL | 0 refills | Status: AC

## 2024-03-16 MED ORDER — METRONIDAZOLE 500 MG PO TABS: 500.0000 mg | ORAL_TABLET | Freq: Two times a day (BID) | ORAL | 0 refills | Status: AC

## 2024-03-19 LAB — URINE CULTURE

## 2024-03-21 MED ORDER — SULFAMETHOXAZOLE-TRIMETHOPRIM 800-160 MG PO TABS: 1.0000 | ORAL_TABLET | Freq: Two times a day (BID) | ORAL | 0 refills | Status: AC

## 2024-08-25 ENCOUNTER — Encounter: Payer: Self-pay | Admitting: Obstetrics

## 2024-08-25 ENCOUNTER — Ambulatory Visit: Admitting: Obstetrics

## 2024-08-25 ENCOUNTER — Other Ambulatory Visit (HOSPITAL_COMMUNITY)
Admission: RE | Admit: 2024-08-25 | Discharge: 2024-08-25 | Disposition: A | Discharge status: Home / Self Care | Source: Ambulatory Visit | Attending: Obstetrics | Admitting: Obstetrics

## 2024-08-25 VITALS — BP 125/88 | HR 73 | Ht 62.0 in | Wt 180.2 lb

## 2024-08-25 DIAGNOSIS — Z01419 Encounter for gynecological examination (general) (routine) without abnormal findings: Secondary | ICD-10-CM | POA: Diagnosis not present

## 2024-08-25 DIAGNOSIS — G8929 Other chronic pain: Secondary | ICD-10-CM

## 2024-08-25 DIAGNOSIS — Z1239 Encounter for other screening for malignant neoplasm of breast: Secondary | ICD-10-CM | POA: Diagnosis not present

## 2024-08-25 DIAGNOSIS — N898 Other specified noninflammatory disorders of vagina: Secondary | ICD-10-CM | POA: Diagnosis not present

## 2024-08-25 DIAGNOSIS — M25512 Pain in left shoulder: Secondary | ICD-10-CM

## 2024-08-25 DIAGNOSIS — M549 Dorsalgia, unspecified: Secondary | ICD-10-CM

## 2024-08-25 DIAGNOSIS — N62 Hypertrophy of breast: Secondary | ICD-10-CM

## 2024-08-25 DIAGNOSIS — M25511 Pain in right shoulder: Secondary | ICD-10-CM

## 2024-08-25 NOTE — Progress Notes (Signed)
 Subjective:        Lydia Blair is a 38 y.o. female here for a routine exam.  Current complaints: Shoulder pain and backache from large, heavy breasts, worse over the past year.  Has tried good bra support without relief.  Personal health questionnaire:  Is patient Ashkenazi Jewish, have a family history of breast and/or ovarian cancer: no Is there a family history of uterine cancer diagnosed at age < 61, gastrointestinal cancer, urinary tract cancer, family member who is a Personnel Officer syndrome-associated carrier: no Is the patient overweight and hypertensive, family history of diabetes, personal history of gestational diabetes, preeclampsia or PCOS: yes Is patient over 75, have PCOS,  family history of premature CHD under age 59, diabetes, smoke, have hypertension or peripheral artery disease:  no At any time, has a partner hit, kicked or otherwise hurt or frightened you?: no Over the past 2 weeks, have you felt down, depressed or hopeless?: no Over the past 2 weeks, have you felt little interest or pleasure in doing things?:no   Gynecologic History Patient's last menstrual period was 08/12/2024 (exact date). Contraception: none Last Pap: 2024. Results were: normal Last mammogram: none. Results were: non  Obstetric History OB History  Gravida Para Term Preterm AB Living  6 4 3  2 4   SAB IAB Ectopic Multiple Live Births   2   3    # Outcome Date GA Lbr Len/2nd Weight Sex Type Anes PTL Lv  6 IAB 2013 [redacted]w[redacted]d       DEC  5 IAB 2013        DEC  4 Term 08/02/09 [redacted]w[redacted]d  6 lb 7 oz (2.92 kg) M CS-LTranv EPI  LIV  3 Term 11/19/06 [redacted]w[redacted]d  7 lb 6 oz (3.345 kg) F CS-LTranv EPI  LIV  2 Term 05/31/04 [redacted]w[redacted]d  6 lb 7 oz (2.92 kg) M CS-LTranv EPI  LIV  1 Para             Past Medical History:  Diagnosis Date   Cardiomyopathy (HCC) 2014   Dermoid cyst of left orbit    Hypertension    Prehypertension     Past Surgical History:  Procedure Laterality Date   CESAREAN SECTION     INCISE AND  DRAIN ABCESS     WISDOM TOOTH EXTRACTION       Current Outpatient Medications:    amLODipine  (NORVASC ) 2.5 MG tablet, Take 1 tablet (2.5 mg total) by mouth daily. (Patient not taking: Reported on 08/25/2024), Disp: 30 tablet, Rfl: 2   hydrochlorothiazide  (HYDRODIURIL ) 12.5 MG tablet, Take 1 tablet (12.5 mg total) by mouth in the morning. (Patient not taking: Reported on 08/25/2024), Disp: 30 tablet, Rfl: 2   lactobacillus acidophilus (BACID) TABS tablet, Take 2 tablets by mouth 3 (three) times daily. (Patient not taking: Reported on 08/25/2024), Disp: , Rfl:    metroNIDAZOLE  (FLAGYL ) 500 MG tablet, Take 1 tablet (500 mg total) by mouth 2 (two) times daily. (Patient not taking: Reported on 08/25/2024), Disp: 14 tablet, Rfl: 0   metroNIDAZOLE  (METROGEL ) 0.75 % vaginal gel, Insert vaginally using applicator nightly for 5 nights.  Abstain from sexual intercourse or tampon use until treatment is complete. (Patient not taking: Reported on 08/25/2024), Disp: 70 g, Rfl: 0 Allergies  Allergen Reactions   Vicodin [Hydrocodone-Acetaminophen ] Itching and Nausea And Vomiting    Social History   Tobacco Use   Smoking status: Former    Current packs/day: 0.00    Types: Cigarettes  Quit date: 01/29/2015    Years since quitting: 9.5   Smokeless tobacco: Never  Substance Use Topics   Alcohol use: Yes    Alcohol/week: 0.0 standard drinks of alcohol    Comment: occ    Family History  Problem Relation Age of Onset   Diabetes Maternal Grandmother    Hypertension Maternal Grandmother    Hypertension Maternal Grandfather    Hypertension Paternal Grandfather    HIV Father    Hypertension Sister       Review of Systems  Constitutional: negative for fatigue and weight loss Respiratory: negative for cough and wheezing Cardiovascular: negative for chest pain, fatigue and palpitations Gastrointestinal: negative for abdominal pain and change in bowel habits Musculoskeletal: positive for myalgias - shoulder  and upper back pain from large, pendulous  breasts Neurological: negative for gait problems and tremors Behavioral/Psych: negative for abusive relationship, depression Endocrine: negative for temperature intolerance    Genitourinary: positive for vaginal discharge.  negative for abnormal menstrual periods, genital lesions, hot flashes, sexual problems  Integument/breast: negative for breast lump, breast tenderness, nipple discharge and skin lesion(s)    Objective:       BP 125/88   Pulse 73   Ht 5' 2 (1.575 m)   Wt 180 lb 3.2 oz (81.7 kg)   LMP 08/12/2024 (Exact Date)   BMI 32.96 kg/m  General:   Alert and no distress  Skin:   no rash or abnormalities  Lungs:   clear to auscultation bilaterally  Heart:   regular rate and rhythm, S1, S2 normal, no murmur, click, rub or gallop  Breasts:   normal without suspicious masses, skin or nipple changes or axillary nodes  Abdomen:  normal findings: no organomegaly, soft, non-tender and no hernia  Pelvis:  External genitalia: normal general appearance Urinary system: urethral meatus normal and bladder without fullness, nontender Vaginal: normal without tenderness, induration or masses Cervix: normal appearance Adnexa: normal bimanual exam Uterus: anteverted and non-tender, normal size   Lab Review Urine pregnancy test Labs reviewed yes Radiologic studies reviewed yes  I have spent a total of 20 minutes of face-to-face time, excluding clinical staff time, reviewing notes and preparing to see patient, ordering tests and/or medications, and counseling the patient.   Assessment:    1. Encounter for gynecological examination with Papanicolaou smear of cervix (Primary) Rx: - Cytology - PAP( Seth Ward)  2. Vaginal discharge Rx: - HIV Antibody (routine testing w rflx) - Hepatitis B surface antigen - RPR W/RFLX TO RPR TITER, TREPONEMAL AB, SCREEN AND DIAGNOSIS - Hepatitis C antibody - Cervicovaginal ancillary only  3. Screening  breast examination Rx: - MM 3D SCREENING MAMMOGRAM BILATERAL BREAST; Future  4. Large breasts - discussed surgical options  5. Chronic pain of both shoulders  6. Chronic upper back pain     Plan:    Education reviewed: calcium supplements, depression evaluation, low fat, low cholesterol diet, safe sex/STD prevention, self breast exams, and weight bearing exercise. Mammogram ordered. Follow up in: 1 year.    Orders Placed This Encounter  Procedures   MM 3D SCREENING MAMMOGRAM BILATERAL BREAST    Standing Status:   Future    Expiration Date:   08/25/2025    Reason for Exam (SYMPTOM  OR DIAGNOSIS REQUIRED):   Needs routine screening    Is the patient pregnant?:   No    Preferred imaging location?:   [facility]   HIV Antibody (routine testing w rflx)   Hepatitis B surface antigen  RPR W/RFLX TO RPR TITER, TREPONEMAL AB, SCREEN AND DIAGNOSIS   Hepatitis C antibody     CARLIN RONAL CENTERS, MD, FACOG Attending Obstetrician & Gynecologist, [facility] for [facility], [facility], [place] 08/25/2024

## 2024-08-25 NOTE — Progress Notes (Signed)
 Pt presents for annual. Pt wants all std testing. No questions or concerns at this time.

## 2024-08-26 LAB — HEPATITIS C ANTIBODY: Hep C Virus Ab: NONREACTIVE

## 2024-08-26 LAB — SYPHILIS: RPR W/REFLEX TO RPR TITER AND TREPONEMAL ANTIBODIES, TRADITIONAL SCREENING AND DIAGNOSIS ALGORITHM: RPR Ser Ql: NONREACTIVE

## 2024-08-26 LAB — HEPATITIS B SURFACE ANTIGEN: Hepatitis B Surface Ag: NEGATIVE

## 2024-08-26 LAB — HIV ANTIBODY (ROUTINE TESTING W REFLEX): HIV Screen 4th Generation wRfx: NONREACTIVE

## 2024-08-29 ENCOUNTER — Ambulatory Visit: Payer: Self-pay | Admitting: Obstetrics

## 2024-08-29 DIAGNOSIS — B9689 Other specified bacterial agents as the cause of diseases classified elsewhere: Secondary | ICD-10-CM

## 2024-08-29 LAB — CERVICOVAGINAL ANCILLARY ONLY
Bacterial Vaginitis (gardnerella): POSITIVE — AB
Candida Glabrata: NEGATIVE
Candida Vaginitis: NEGATIVE
Chlamydia: NEGATIVE
Comment: NEGATIVE
Comment: NEGATIVE
Comment: NEGATIVE
Comment: NEGATIVE
Comment: NEGATIVE
Comment: NORMAL
Neisseria Gonorrhea: NEGATIVE
Trichomonas: NEGATIVE

## 2024-08-29 MED ORDER — METRONIDAZOLE 500 MG PO TABS: 500.0000 mg | ORAL_TABLET | Freq: Two times a day (BID) | ORAL | 2 refills | Status: AC

## 2024-08-31 LAB — CYTOLOGY - PAP
Comment: NEGATIVE
Diagnosis: NEGATIVE
High risk HPV: NEGATIVE
